# Patient Record
Sex: Male | Born: 1970 | Race: Black or African American | Hispanic: No | Marital: Single | Smoking: Never smoker
Health system: Southern US, Community
[De-identification: ages and names within clinical notes are randomized; demographics above are authoritative.]

## PROBLEM LIST (undated history)

## (undated) DIAGNOSIS — U071 COVID-19: Secondary | ICD-10-CM

---

## 2005-03-20 ENCOUNTER — Ambulatory Visit: Payer: Self-pay | Admitting: Family Medicine

## 2018-04-16 ENCOUNTER — Emergency Department (HOSPITAL_COMMUNITY)
Admission: EM | Admit: 2018-04-16 | Discharge: 2018-04-16 | Disposition: A | Discharge status: Home / Self Care | Payer: Self-pay | Attending: Emergency Medicine | Admitting: Emergency Medicine

## 2018-04-16 ENCOUNTER — Emergency Department (HOSPITAL_COMMUNITY): Payer: Self-pay

## 2018-04-16 ENCOUNTER — Other Ambulatory Visit: Payer: Self-pay

## 2018-04-16 ENCOUNTER — Encounter (HOSPITAL_COMMUNITY): Payer: Self-pay

## 2018-04-16 DIAGNOSIS — M545 Low back pain, unspecified: Secondary | ICD-10-CM

## 2018-04-16 DIAGNOSIS — K5900 Constipation, unspecified: Secondary | ICD-10-CM

## 2018-04-16 MED ORDER — DIAZEPAM 5 MG PO TABS
5.0000 mg | ORAL_TABLET | Freq: Once | ORAL | Status: AC
  Administered 2018-04-16: 5 mg via ORAL
  Filled 2018-04-16: qty 1

## 2018-04-16 MED ORDER — ACETAMINOPHEN 500 MG PO TABS
1000.0000 mg | ORAL_TABLET | Freq: Once | ORAL | Status: AC
  Administered 2018-04-16: 1000 mg via ORAL
  Filled 2018-04-16: qty 2

## 2018-04-16 MED ORDER — TRAMADOL HCL 50 MG PO TABS
100.0000 mg | ORAL_TABLET | Freq: Once | ORAL | Status: AC
  Administered 2018-04-16: 100 mg via ORAL
  Filled 2018-04-16: qty 2

## 2018-04-16 MED ORDER — NAPROXEN 500 MG PO TABS: ORAL_TABLET | ORAL | 0 refills | Status: DC

## 2018-04-16 MED ORDER — FAMOTIDINE 20 MG PO TABS: 20.0000 mg | ORAL_TABLET | Freq: Two times a day (BID) | ORAL | 0 refills | Status: DC

## 2018-04-16 MED ORDER — DEXAMETHASONE SODIUM PHOSPHATE 10 MG/ML IJ SOLN
10.0000 mg | Freq: Once | INTRAMUSCULAR | Status: AC
  Administered 2018-04-16: 10 mg via INTRAMUSCULAR
  Filled 2018-04-16: qty 1

## 2018-04-16 MED ORDER — PREDNISONE 20 MG PO TABS: ORAL_TABLET | ORAL | 0 refills | Status: DC

## 2018-04-16 MED ORDER — KETOROLAC TROMETHAMINE 30 MG/ML IJ SOLN
30.0000 mg | Freq: Once | INTRAMUSCULAR | Status: AC
  Administered 2018-04-16: 30 mg via INTRAMUSCULAR
  Filled 2018-04-16: qty 1

## 2018-04-16 MED ORDER — CYCLOBENZAPRINE HCL 10 MG PO TABS: 10.0000 mg | ORAL_TABLET | Freq: Three times a day (TID) | ORAL | 0 refills | Status: DC | PRN

## 2018-04-16 NOTE — Discharge Instructions (Signed)
Get miralax and put one dose or 17 g in 8 ounces of water,  take 1 dose every 30 minutes for 2-3 hours or until you  get good results and then once or twice daily to prevent constipation.   Use ice and heat on your back for comfort.  Take the prescriptions as prescribed.  You can also take acetaminophen 1000 mg 4 times a day for the pain.  Recheck as needed.

## 2018-04-16 NOTE — ED Triage Notes (Addendum)
Pt reports lower back pain that started yesterday, woke up this morning to use the bathroom and got chills, diaphoretic and had cramps like "had to use the bathroom", but unable to. Pt says "feels like he has gas". Pt reports last BM was yesterday and before that was a couple of days. Pt says he feels constipated.
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
## 2018-04-16 NOTE — ED Notes (Signed)
Pt in xray

## 2019-04-24 ENCOUNTER — Emergency Department (HOSPITAL_COMMUNITY)
Admission: EM | Admit: 2019-04-24 | Discharge: 2019-04-24 | Disposition: A | Discharge status: Home / Self Care | Payer: HRSA Program | Attending: Emergency Medicine | Admitting: Emergency Medicine

## 2019-04-24 ENCOUNTER — Other Ambulatory Visit: Payer: Self-pay

## 2019-04-24 ENCOUNTER — Emergency Department (HOSPITAL_COMMUNITY): Payer: HRSA Program

## 2019-04-24 ENCOUNTER — Encounter (HOSPITAL_COMMUNITY): Payer: Self-pay | Admitting: *Deleted

## 2019-04-24 DIAGNOSIS — U071 COVID-19: Secondary | ICD-10-CM | POA: Insufficient documentation

## 2019-04-24 DIAGNOSIS — Z79899 Other long term (current) drug therapy: Secondary | ICD-10-CM | POA: Diagnosis not present

## 2019-04-24 DIAGNOSIS — R197 Diarrhea, unspecified: Secondary | ICD-10-CM | POA: Diagnosis not present

## 2019-04-24 DIAGNOSIS — R509 Fever, unspecified: Secondary | ICD-10-CM | POA: Diagnosis present

## 2019-04-24 DIAGNOSIS — R05 Cough: Secondary | ICD-10-CM | POA: Diagnosis not present

## 2019-04-24 DIAGNOSIS — B349 Viral infection, unspecified: Secondary | ICD-10-CM

## 2019-04-24 DIAGNOSIS — M7918 Myalgia, other site: Secondary | ICD-10-CM | POA: Diagnosis not present

## 2019-04-24 MED ORDER — BENZONATATE 100 MG PO CAPS: 100.0000 mg | ORAL_CAPSULE | Freq: Three times a day (TID) | ORAL | 0 refills | Status: DC

## 2019-04-24 MED ORDER — ALBUTEROL SULFATE HFA 108 (90 BASE) MCG/ACT IN AERS
2.0000 | INHALATION_SPRAY | Freq: Once | RESPIRATORY_TRACT | Status: AC
  Administered 2019-04-24: 15:00:00 2 via RESPIRATORY_TRACT
  Filled 2019-04-24: qty 6.7

## 2019-04-24 MED ORDER — PREDNISONE 10 MG (21) PO TBPK: ORAL_TABLET | ORAL | 0 refills | Status: DC

## 2019-04-24 MED ORDER — BENZONATATE 100 MG PO CAPS
200.0000 mg | ORAL_CAPSULE | Freq: Once | ORAL | Status: AC
  Administered 2019-04-24: 13:00:00 200 mg via ORAL
  Filled 2019-04-24: qty 2

## 2019-04-24 MED ORDER — ACETAMINOPHEN 500 MG PO TABS
1000.0000 mg | ORAL_TABLET | Freq: Once | ORAL | Status: AC
  Administered 2019-04-24: 1000 mg via ORAL
  Filled 2019-04-24: qty 2

## 2019-04-24 MED ORDER — PREDNISONE 50 MG PO TABS
60.0000 mg | ORAL_TABLET | Freq: Once | ORAL | Status: AC
  Administered 2019-04-24: 60 mg via ORAL
  Filled 2019-04-24: qty 1

## 2019-04-24 NOTE — ED Provider Notes (Signed)
[facility] EMERGENCY DEPARTMENT Provider Note   CSN: [id number] Arrival date & time: 04/24/19  1127     History Chief Complaint  Patient presents with  . Fever  . Generalized Body Aches  . Diarrhea    Benjamin Robinson is a 49 y.o. male.  HPI      Benjamin Robinson is a 49 y.o. male, patient with no pertinent past medical history, presenting to the ED with cough and shortness of breath for the past week.  Also endorses fatigue, fevers, soft stools, and body aches.  He has intermittently been taking ibuprofen with last dose several days ago. Patient denies contact with known or suspected COVID-19 patients.  Denies exertional chest pain, abdominal pain, nausea, vomiting, syncope, or any other complaints.      History reviewed. No pertinent past medical history.  There are no problems to display for this patient.   History reviewed. No pertinent surgical history.     History reviewed. No pertinent family history.  Social History   Tobacco Use  . Smoking status: Never Smoker  . Smokeless tobacco: Never Used  Substance Use Topics  . Alcohol use: Never  . Drug use: Never    Home Medications Prior to Admission medications   Medication Sig Start Date End Date Taking? Authorizing Provider  benzonatate (TESSALON) 100 MG capsule Take 1 capsule (100 mg total) by mouth every 8 (eight) hours. 04/24/19   Benjamin Taite C, PA-Robinson  cyclobenzaprine (FLEXERIL) 10 MG tablet Take 1 tablet (10 mg total) by mouth 3 (three) times daily as needed for muscle spasms. 04/16/18   Benjamin Porter, MD  famotidine (PEPCID) 20 MG tablet Take 1 tablet (20 mg total) by mouth 2 (two) times daily. 04/16/18   Benjamin Porter, MD  naproxen (NAPROSYN) 500 MG tablet Take 1 po BID with food prn pain 04/16/18   Benjamin Porter, MD  predniSONE (STERAPRED UNI-PAK 21 TAB) 10 MG (21) TBPK tablet Take 6 tabs (62m) day 1, 5 tabs (554m day 2, 4 tabs (4089mday 3, 3 tabs (49m87may 4, 2 tabs (20mg81my 5, and 1 tab (10mg)79m 6. 04/24/19    Benjamin Krammes C, PA-Robinson    Allergies    Patient has no known allergies.  Review of Systems   Review of Systems  Constitutional: Positive for fatigue and fever.  Respiratory: Positive for cough and shortness of breath.   Cardiovascular: Negative for chest pain and leg swelling.  Gastrointestinal: Positive for diarrhea. Negative for abdominal pain, blood in stool, nausea and vomiting.  Musculoskeletal: Positive for myalgias.  Neurological: Negative for dizziness and syncope.  All other systems reviewed and are negative.   Physical Exam Updated Vital Signs BP 130/87   Pulse 72   Temp (!) 100.6 F (38.1 Robinson) (Oral)   Resp 16   Ht 5' 9"  (1.753 m)   SpO2 97%   BMI 30.27 kg/m   Physical Exam Vitals and nursing note reviewed.  Constitutional:      General: He is not in acute distress.    Appearance: He is well-developed. He is not diaphoretic.  HENT:     Head: Normocephalic and atraumatic.     Mouth/Throat:     Mouth: Mucous membranes are moist.     Pharynx: Oropharynx is clear.  Eyes:     Conjunctiva/sclera: Conjunctivae normal.  Cardiovascular:     Rate and Rhythm: Normal rate and regular rhythm.     Pulses: Normal pulses.  Radial pulses are 2+ on the right side and 2+ on the left side.       Posterior tibial pulses are 2+ on the right side and 2+ on the left side.     Heart sounds: Normal heart sounds.     Comments: Tactile temperature in the extremities appropriate and equal bilaterally. Pulmonary:     Effort: Pulmonary effort is normal. No respiratory distress.     Breath sounds: Normal breath sounds.     Comments: No increased work of breathing.  Speaks in full sentences without difficulty. Abdominal:     Palpations: Abdomen is soft.     Tenderness: There is no abdominal tenderness. There is no guarding.  Musculoskeletal:     Cervical back: Neck supple.     Right lower leg: No edema.     Left lower leg: No edema.  Lymphadenopathy:     Cervical: No cervical  adenopathy.  Skin:    General: Skin is warm and dry.  Neurological:     Mental Status: He is alert.  Psychiatric:        Mood and Affect: Mood and affect normal.        Speech: Speech normal.        Behavior: Behavior normal.     ED Results / Procedures / Treatments   Labs (all labs ordered are listed, but only abnormal results are displayed) Labs Reviewed  NOVEL CORONAVIRUS, NAA (HOSP ORDER, SEND-OUT TO REF LAB; TAT 18-24 HRS)    EKG None  Radiology DG Chest Portable 1 View  Result Date: 04/24/2019 CLINICAL DATA:  Fever, body aches, diarrhea EXAM: PORTABLE CHEST 1 VIEW COMPARISON:  None. FINDINGS: The heart size and mediastinal contours are within normal limits. No atelectasis or airspace consolidation identified. Mild increase interstitial markings are noted in both lungs with a lower lung zone predominance. The visualized skeletal structures are unremarkable. IMPRESSION: 1. Mild nonspecific increased interstitial markings. Findings may reflect pulmonary vascular congestion versus early atypical inflammation/infection no airspace consolidation noted. Electronically Signed   By: Kerby Moors M.D.   On: 04/24/2019 13:03    Procedures Procedures (including critical care time)  Medications Ordered in ED Medications  benzonatate (TESSALON) capsule 200 mg (200 mg Oral Given 04/24/19 1302)  acetaminophen (TYLENOL) tablet 1,000 mg (1,000 mg Oral Given 04/24/19 1302)  albuterol (VENTOLIN HFA) 108 (90 Base) MCG/ACT inhaler 2 puff (2 puffs Inhalation Given 04/24/19 1440)  predniSONE (DELTASONE) tablet 60 mg (60 mg Oral Given 04/24/19 1440)    ED Course  I have reviewed the triage vital signs and the nursing notes.  Pertinent labs & imaging results that were available during my care of the patient were reviewed by me and considered in my medical decision making (see chart for details).    MDM Rules/Calculators/A&P                      Patient presents with cough, shortness of breath,  and body aches. Patient is nontoxic appearing, not tachycardic, not tachypneic, not hypotensive, maintains excellent SPO2 on room air, and is in no apparent distress.  Mildly febrile. Patient voiced improvement with oral medications here in the ED. Chest x-ray without consolidation.  At this time, patient symptoms likely stem from a viral source. Coronavirus test pending. The patient was given instructions for home care as well as return precautions. Patient voices understanding of these instructions, accepts the plan, and is comfortable with discharge.     Benjamin Robinson was  evaluated in Emergency Department on 04/24/2019 for the symptoms described in the history of present illness. He was evaluated in the context of the global COVID-19 pandemic, which necessitated consideration that the patient might be at risk for infection with the SARS-CoV-2 virus that causes COVID-19. Institutional protocols and algorithms that pertain to the evaluation of patients at risk for COVID-19 are in a state of rapid change based on information released by regulatory bodies including the CDC and federal and state organizations. These policies and algorithms were followed during the patient's care in the ED.  Vitals:   04/24/19 1143 04/24/19 1310 04/24/19 1400 04/24/19 1430  BP:  (!) 115/100  128/86  Pulse:  97 88 80  Resp:  18 18 18   Temp:    99.1 F (37.3 Robinson)  TempSrc:      SpO2:  100% 97% 98%  Height: 5' 9"  (1.753 m)       Final Clinical Impression(s) / ED Diagnoses Final diagnoses:  Viral syndrome    Rx / DC Orders ED Discharge Orders         Ordered    predniSONE (STERAPRED UNI-PAK 21 TAB) 10 MG (21) TBPK tablet     04/24/19 1351    benzonatate (TESSALON) 100 MG capsule  Every 8 hours     04/24/19 1351           Lorayne Bender, PA-Robinson 04/24/19 1507    Noemi Chapel, MD 05/04/19 1747

## 2019-04-24 NOTE — ED Triage Notes (Signed)
Fever, body aches for the past week, also has diarrhea

## 2019-04-24 NOTE — Discharge Instructions (Addendum)
Test Results for COVID-19 pending  You have a test pending for COVID-19.  Results typically return within about 48 hours.  Be sure to check MyChart for updated results.  We recommend isolating yourself until results are received.  Patients who have symptoms consistent with COVID-19 should self isolated for: At least 3 days (72 hours) have passed since recovery, defined as resolution of fever without the use of fever reducing medications and improvement in respiratory symptoms (e.g., cough, shortness of breath), and At least 7 days have passed since symptoms first appeared.  If you have no symptoms, but your test returns positive, recommend isolating for at least 10 days.   Your symptoms are likely consistent with a viral illness. Viruses do not require or respond to antibiotics. Treatment is symptomatic care and it is important to note that these symptoms may last for 7-14 days.   Hand washing: Wash your hands throughout the day, but especially before and after touching the face, using the restroom, sneezing, coughing, or touching surfaces that have been coughed or sneezed upon. Hydration: Symptoms of most illnesses will be intensified and complicated by dehydration. Dehydration can also extend the duration of symptoms. Drink plenty of fluids and get plenty of rest. You should be drinking at least half a liter of water an hour to stay hydrated. Electrolyte drinks (ex. Gatorade, Powerade, Pedialyte) are also encouraged. You should be drinking enough fluids to make your urine light yellow, almost clear. If this is not the case, you are not drinking enough water. Please note that some of the treatments indicated below will not be effective if you are not adequately hydrated. Diet: Please concentrate on hydration, however, you may introduce food slowly.  Start with a clear liquid diet, progressed to a full liquid diet, and then bland solids as you are able. Pain or fever: Ibuprofen, Naproxen, or  acetaminophen (generic for Tylenol) for pain or fever.  Antiinflammatory medications: Take 600 mg of ibuprofen every 6 hours or 440 mg (over the counter dose) to 500 mg (prescription dose) of naproxen every 12 hours for the next 3 days. After this time, these medications may be used as needed for pain. Take these medications with food to avoid upset stomach. Choose only one of these medications, do not take them together. Acetaminophen (generic for Tylenol): Should you continue to have additional pain while taking the ibuprofen or naproxen, you may add in acetaminophen as needed. Your daily total maximum amount of acetaminophen from all sources should be limited to 4064m/day for persons without liver problems, or 20019mday for those with liver problems. Nausea/vomiting: Use the ondansetron (generic for Zofran) for nausea or vomiting.  This medication may not prevent all vomiting or nausea, but can help facilitate better hydration. Things that can help with nausea/vomiting also include peppermint/menthol candies, vitamin B12, and ginger. Diarrhea: May use medications such as loperamide (Imodium) or Bismuth subsalicylate (Pepto-Bismol). Cough: Use the benzonatate (generic for Tessalon) for cough.  Teas, warm liquids, broths, and honey can also help with cough. Albuterol: May use the albuterol as needed for instances of shortness of breath. Prednisone: Take the prednisone, as directed, in its entirety. Zyrtec or Claritin: May add these medication daily to control underlying symptoms of congestion, sneezing, and other signs of allergies.  These medications are available over-the-counter. Generics: Cetirizine (generic for Zyrtec) and loratadine (generic for Claritin). Fluticasone: Use fluticasone (generic for Flonase), as directed, for nasal and sinus congestion.  This medication is available over-the-counter. Congestion: Plain guaifenesin (generic for  plain Mucinex) may help relieve congestion. Saline  sinus rinses and saline nasal sprays may also help relieve congestion. If you do not have high blood pressure, heart problems, or an allergy to such medications, you may also try phenylephrine or Sudafed. Sore throat: Warm liquids or Chloraseptic spray may help soothe a sore throat. Gargle twice a day with a salt water solution made from a half teaspoon of salt in a cup of warm water.  Follow up: Follow up with a primary care provider within the next two weeks should symptoms fail to resolve. Return: Return to the ED for significantly worsening symptoms, shortness of breath, persistent vomiting, large amounts of blood in stool, or any other major concerns.  For prescription assistance, may try using prescription discount sites or apps, such as goodrx.com

## 2019-04-25 LAB — NOVEL CORONAVIRUS, NAA (HOSP ORDER, SEND-OUT TO REF LAB; TAT 18-24 HRS): SARS-CoV-2, NAA: DETECTED — AB

## 2019-04-26 ENCOUNTER — Telehealth: Payer: Self-pay | Admitting: Nurse Practitioner

## 2019-04-26 NOTE — Telephone Encounter (Signed)
Called to discuss with patient about Covid symptoms and the use of bamlanivimab, a monoclonal antibody infusion for those with mild to moderate Covid symptoms and at a high risk of hospitalization.  Pt is not qualified for this infusion at the [facility] infusion center due to on review of notes symptoms started 04/17/2019, which would place > 10 days prior to infusion + no qualifying risks noted in chart.

## 2019-04-27 ENCOUNTER — Telehealth (HOSPITAL_COMMUNITY): Payer: Self-pay

## 2019-04-30 ENCOUNTER — Other Ambulatory Visit: Payer: Self-pay

## 2019-04-30 ENCOUNTER — Encounter (HOSPITAL_COMMUNITY): Payer: Self-pay

## 2019-04-30 ENCOUNTER — Inpatient Hospital Stay (HOSPITAL_COMMUNITY)
Admission: EM | Admit: 2019-04-30 | Discharge: 2019-05-03 | DRG: 091 | Disposition: A | Discharge status: Home / Self Care | Payer: Self-pay | Attending: Internal Medicine | Admitting: Internal Medicine

## 2019-04-30 ENCOUNTER — Emergency Department (HOSPITAL_COMMUNITY): Payer: Self-pay

## 2019-04-30 DIAGNOSIS — U071 COVID-19: Secondary | ICD-10-CM | POA: Diagnosis present

## 2019-04-30 DIAGNOSIS — J1282 Pneumonia due to coronavirus disease 2019: Secondary | ICD-10-CM | POA: Diagnosis present

## 2019-04-30 DIAGNOSIS — T380X5A Adverse effect of glucocorticoids and synthetic analogues, initial encounter: Secondary | ICD-10-CM | POA: Diagnosis present

## 2019-04-30 DIAGNOSIS — G92 Toxic encephalopathy: Principal | ICD-10-CM | POA: Diagnosis present

## 2019-04-30 DIAGNOSIS — E860 Dehydration: Secondary | ICD-10-CM | POA: Diagnosis present

## 2019-04-30 DIAGNOSIS — R319 Hematuria, unspecified: Secondary | ICD-10-CM | POA: Diagnosis present

## 2019-04-30 DIAGNOSIS — N390 Urinary tract infection, site not specified: Secondary | ICD-10-CM | POA: Diagnosis present

## 2019-04-30 DIAGNOSIS — G9340 Encephalopathy, unspecified: Secondary | ICD-10-CM | POA: Diagnosis present

## 2019-04-30 HISTORY — DX: COVID-19: U07.1

## 2019-04-30 MED ORDER — ACETAMINOPHEN 500 MG PO TABS
1000.0000 mg | ORAL_TABLET | Freq: Once | ORAL | Status: AC
  Administered 2019-04-30: 1000 mg via ORAL
  Filled 2019-04-30: qty 2

## 2019-04-30 MED ORDER — SODIUM CHLORIDE 0.9 % IV SOLN: INTRAVENOUS | Status: DC

## 2019-04-30 MED ORDER — LACTATED RINGERS IV BOLUS
1000.0000 mL | Freq: Once | INTRAVENOUS | Status: AC
  Administered 2019-04-30: 1000 mL via INTRAVENOUS

## 2019-04-30 NOTE — ED Triage Notes (Signed)
Pt brought to ED for AMS. Pt is covid +. Pt not making sense in triage. Pt diaphoretic. Pt had heavy coat and 3 shirts on. Pt unable to answer orientation questions. Pt disoriented.

## 2019-05-01 ENCOUNTER — Emergency Department (HOSPITAL_COMMUNITY): Payer: Self-pay

## 2019-05-01 ENCOUNTER — Encounter (HOSPITAL_COMMUNITY): Payer: Self-pay | Admitting: Family Medicine

## 2019-05-01 DIAGNOSIS — U071 COVID-19: Secondary | ICD-10-CM

## 2019-05-01 DIAGNOSIS — J1282 Pneumonia due to coronavirus disease 2019: Secondary | ICD-10-CM | POA: Diagnosis present

## 2019-05-01 DIAGNOSIS — G9340 Encephalopathy, unspecified: Secondary | ICD-10-CM

## 2019-05-01 HISTORY — DX: COVID-19: U07.1

## 2019-05-01 HISTORY — DX: Pneumonia due to coronavirus disease 2019: J12.82

## 2019-05-01 LAB — CBC WITH DIFFERENTIAL/PLATELET
Abs Immature Granulocytes: 0.05 10*3/uL (ref 0.00–0.07)
Basophils Absolute: 0 10*3/uL (ref 0.0–0.1)
Basophils Relative: 1 %
Eosinophils Absolute: 0 10*3/uL (ref 0.0–0.5)
Eosinophils Relative: 0 %
HCT: 53.5 % — ABNORMAL HIGH (ref 39.0–52.0)
Hemoglobin: 17.3 g/dL — ABNORMAL HIGH (ref 13.0–17.0)
Immature Granulocytes: 1 %
Lymphocytes Relative: 20 %
Lymphs Abs: 1 10*3/uL (ref 0.7–4.0)
MCH: 26.5 pg (ref 26.0–34.0)
MCHC: 32.3 g/dL (ref 30.0–36.0)
MCV: 82.1 fL (ref 80.0–100.0)
Monocytes Absolute: 0.5 10*3/uL (ref 0.1–1.0)
Monocytes Relative: 10 %
Neutro Abs: 3.3 10*3/uL (ref 1.7–7.7)
Neutrophils Relative %: 68 %
Platelets: 281 10*3/uL (ref 150–400)
RBC: 6.52 MIL/uL — ABNORMAL HIGH (ref 4.22–5.81)
RDW: 12.8 % (ref 11.5–15.5)
WBC: 4.9 10*3/uL (ref 4.0–10.5)
nRBC: 0 % (ref 0.0–0.2)

## 2019-05-01 LAB — COMPREHENSIVE METABOLIC PANEL
ALT: 89 U/L — ABNORMAL HIGH (ref 0–44)
AST: 43 U/L — ABNORMAL HIGH (ref 15–41)
Albumin: 3.9 g/dL (ref 3.5–5.0)
Alkaline Phosphatase: 49 U/L (ref 38–126)
Anion gap: 14 (ref 5–15)
BUN: 23 mg/dL — ABNORMAL HIGH (ref 6–20)
CO2: 27 mmol/L (ref 22–32)
Calcium: 9.2 mg/dL (ref 8.9–10.3)
Chloride: 94 mmol/L — ABNORMAL LOW (ref 98–111)
Creatinine, Ser: 1.21 mg/dL (ref 0.61–1.24)
GFR calc Af Amer: >60 mL/min (ref >60)
GFR calc non Af Amer: >60 mL/min (ref >60)
Glucose, Bld: 171 mg/dL — ABNORMAL HIGH (ref 70–99)
Potassium: 3.2 mmol/L — ABNORMAL LOW (ref 3.5–5.1)
Sodium: 135 mmol/L (ref 135–145)
Total Bilirubin: 1.2 mg/dL (ref 0.3–1.2)
Total Protein: 8.8 g/dL — ABNORMAL HIGH (ref 6.5–8.1)

## 2019-05-01 LAB — URINALYSIS, COMPLETE (UACMP) WITH MICROSCOPIC
Bilirubin Urine: NEGATIVE
Glucose, UA: 150 mg/dL — AB
Ketones, ur: 5 mg/dL — AB
Leukocytes,Ua: NEGATIVE
Nitrite: NEGATIVE
Protein, ur: 100 mg/dL — AB
Specific Gravity, Urine: 1.03 (ref 1.005–1.030)
pH: 6 (ref 5.0–8.0)

## 2019-05-01 LAB — LACTIC ACID, PLASMA: Lactic Acid, Venous: 1.7 mmol/L (ref 0.5–1.9)

## 2019-05-01 LAB — ABO/RH: ABO/RH(D): O NEG

## 2019-05-01 LAB — CBG MONITORING, ED: Glucose-Capillary: 141 mg/dL — ABNORMAL HIGH (ref 70–99)

## 2019-05-01 LAB — RAPID URINE DRUG SCREEN, HOSP PERFORMED
Amphetamines: NOT DETECTED
Barbiturates: NOT DETECTED
Benzodiazepines: NOT DETECTED
Cocaine: NOT DETECTED
Opiates: NOT DETECTED
Tetrahydrocannabinol: NOT DETECTED

## 2019-05-01 LAB — HIV ANTIBODY (ROUTINE TESTING W REFLEX): HIV Screen 4th Generation wRfx: NONREACTIVE

## 2019-05-01 MED ORDER — SODIUM CHLORIDE 0.9 % IV SOLN: INTRAVENOUS | Status: DC

## 2019-05-01 MED ORDER — POTASSIUM CHLORIDE 10 MEQ/100ML IV SOLN
10.0000 meq | INTRAVENOUS | Status: AC
  Administered 2019-05-01 (×3): 10 meq via INTRAVENOUS
  Filled 2019-05-01 (×3): qty 100

## 2019-05-01 MED ORDER — ONDANSETRON HCL 4 MG/2ML IJ SOLN: 4.0000 mg | Freq: Four times a day (QID) | INTRAMUSCULAR | Status: DC | PRN

## 2019-05-01 MED ORDER — SODIUM CHLORIDE 0.9 % IV SOLN
200.0000 mg | Freq: Once | INTRAVENOUS | Status: AC
  Administered 2019-05-01: 200 mg via INTRAVENOUS
  Filled 2019-05-01: qty 40

## 2019-05-01 MED ORDER — LACTATED RINGERS IV SOLN: INTRAVENOUS | Status: DC

## 2019-05-01 MED ORDER — POTASSIUM CHLORIDE CRYS ER 20 MEQ PO TBCR
40.0000 meq | EXTENDED_RELEASE_TABLET | Freq: Once | ORAL | Status: AC
  Administered 2019-05-01: 40 meq via ORAL
  Filled 2019-05-01: qty 2

## 2019-05-01 MED ORDER — ONDANSETRON HCL 4 MG PO TABS: 4.0000 mg | ORAL_TABLET | Freq: Four times a day (QID) | ORAL | Status: DC | PRN

## 2019-05-01 MED ORDER — SODIUM CHLORIDE 0.9 % IV SOLN: 200.0000 mg | Freq: Once | INTRAVENOUS | Status: DC

## 2019-05-01 MED ORDER — MAGIC MOUTHWASH
15.0000 mL | Freq: Four times a day (QID) | ORAL | Status: DC | PRN
  Administered 2019-05-01 – 2019-05-02 (×3): 15 mL via ORAL
  Filled 2019-05-01 (×2): qty 15

## 2019-05-01 MED ORDER — ACETAMINOPHEN 325 MG PO TABS
650.0000 mg | ORAL_TABLET | Freq: Four times a day (QID) | ORAL | Status: DC | PRN
  Administered 2019-05-02 (×2): 650 mg via ORAL
  Filled 2019-05-01 (×2): qty 2

## 2019-05-01 MED ORDER — LORAZEPAM 2 MG/ML IJ SOLN
1.0000 mg | Freq: Once | INTRAMUSCULAR | Status: AC
  Administered 2019-05-01: 1 mg via INTRAVENOUS
  Filled 2019-05-01: qty 1

## 2019-05-01 MED ORDER — SODIUM CHLORIDE 0.9 % IV SOLN
100.0000 mg | Freq: Every day | INTRAVENOUS | Status: DC
  Administered 2019-05-02 – 2019-05-03 (×2): 100 mg via INTRAVENOUS
  Filled 2019-05-01: qty 100
  Filled 2019-05-01 (×2): qty 20

## 2019-05-01 MED ORDER — ENOXAPARIN SODIUM 40 MG/0.4ML ~~LOC~~ SOLN
40.0000 mg | SUBCUTANEOUS | Status: DC
  Administered 2019-05-01 – 2019-05-03 (×3): 40 mg via SUBCUTANEOUS
  Filled 2019-05-01 (×3): qty 0.4

## 2019-05-01 MED ORDER — SODIUM CHLORIDE 0.9 % IV SOLN
1.0000 g | INTRAVENOUS | Status: DC
  Administered 2019-05-01 – 2019-05-02 (×2): 1 g via INTRAVENOUS
  Filled 2019-05-01 (×2): qty 10

## 2019-05-01 MED ORDER — SODIUM CHLORIDE 0.9 % IV SOLN: 100.0000 mg | Freq: Every day | INTRAVENOUS | Status: DC

## 2019-05-01 NOTE — ED Notes (Signed)
Patient transported to CT 

## 2019-05-01 NOTE — ED Notes (Signed)
NAD  Pt continues to await bed assignment

## 2019-05-01 NOTE — ED Provider Notes (Signed)
Emergency Department Provider Note   I have reviewed the triage vital signs and the nursing notes.   HISTORY  Chief Complaint Altered Mental Status   HPI Benjamin Robinson is a 49 y.o. male who presents to the emergency department today for altered mental status.  Patient is disoriented is unable to give history.   History is obtained from his fiance.  She states that he was diagnosed with coronavirus.  He started having a cough shortly after Christmas was diagnosed on New Year's Day.  He was seen here given a prescription of prednisone and he started that on January 4 that sounds like.  He took 3 doses and started having progressively worsening abnormal behavior to the point where the last couple days he has been having hallucinations, delusions.  Has no psychiatric history.  Nothing at this before.  No alcohol or drugs per the fianc. Patient denies headache or neck pain.   No other associated or modifying symptoms.   Level V Caveat 2/2 disorientation and encelopathy  Past Medical History:  Diagnosis Date  . COVID-19     There are no problems to display for this patient.   History reviewed. No pertinent surgical history.  Current Outpatient Rx  . Order #: [id number] Class: Normal  . Order #: [id number] Class: Normal  . Order #: [id number] Class: Print  . Order #: [id number] Class: Print  . Order #: [id number] Class: Print    Allergies Patient has no known allergies.  No family history on file.  Social History Social History   Tobacco Use  . Smoking status: Never Smoker  . Smokeless tobacco: Never Used  Substance Use Topics  . Alcohol use: Never  . Drug use: Never    Review of Systems  All other systems negative except as documented in the HPI. All pertinent positives and negatives as reviewed in the HPI. ____________________________________________   PHYSICAL EXAM:  VITAL SIGNS: ED Triage Vitals  Enc Vitals Group     BP 04/30/19 2310 (!) 134/104     Pulse Rate  04/30/19 2310 (!) 124     Resp 04/30/19 2310 20     Temp 04/30/19 2310 99.2 F (37.3 C)     Temp Source 04/30/19 2310 Oral     SpO2 04/30/19 2310 98 %     Weight 04/30/19 2306 205 lb 0.4 oz (93 kg)     Height 04/30/19 2306 5' 9"  (1.753 m)    Constitutional: Alert but disoriented to place, time and situation. Eyes: Conjunctivae are normal. PERRL. EOMI. Head: Atraumatic. Nose: No congestion/rhinnorhea. Mouth/Throat: Mucous membranes are dry.  Oropharynx non-erythematous. Neck: No stridor.  No meningeal signs.   Cardiovascular: tachycardic rate, regular rhythm. Good peripheral circulation. Grossly normal heart sounds.   Respiratory: tachypneic respiratory effort.  No retractions. Lungs CTAB. Gastrointestinal: Soft and nontender. No distention.  Musculoskeletal: No lower extremity tenderness nor edema. No gross deformities of extremities. Neurologic:  Normal speech and language. No gross focal neurologic deficits are appreciated.  Skin:  Skin is warm, dry and intact. No rash noted. Psychiatric: darting eyes, responding to internal stimuli, actively hallucinating  ____________________________________________   LABS (all labs ordered are listed, but only abnormal results are displayed)  Labs Reviewed  COMPREHENSIVE METABOLIC PANEL - Abnormal; Notable for the following components:      Result Value   Potassium 3.2 (*)    Chloride 94 (*)    Glucose, Bld 171 (*)    BUN 23 (*)    Total Protein 8.8 (*)  AST 43 (*)    ALT 89 (*)    All other components within normal limits  CBC WITH DIFFERENTIAL/PLATELET - Abnormal; Notable for the following components:   RBC 6.52 (*)    Hemoglobin 17.3 (*)    HCT 53.5 (*)    All other components within normal limits  URINALYSIS, COMPLETE (UACMP) WITH MICROSCOPIC - Abnormal; Notable for the following components:   Color, Urine AMBER (*)    APPearance HAZY (*)    Glucose, UA 150 (*)    Hgb urine dipstick MODERATE (*)    Ketones, ur 5 (*)     Protein, ur 100 (*)    Bacteria, UA RARE (*)    Non Squamous Epithelial 0-5 (*)    All other components within normal limits  CBG MONITORING, ED - Abnormal; Notable for the following components:   Glucose-Capillary 141 (*)    All other components within normal limits  CULTURE, BLOOD (ROUTINE X 2)  CULTURE, BLOOD (ROUTINE X 2)  URINE CULTURE  RAPID URINE DRUG SCREEN, HOSP PERFORMED  LACTIC ACID, PLASMA   ____________________________________________  EKG   EKG Interpretation  Date/Time:  Thursday April 30 2019 23:07:35 EST Ventricular Rate:  115 PR Interval:    QRS Duration: 96 QT Interval:  323 QTC Calculation: 447 R Axis:   48 Text Interpretation: Sinus tachycardia Left atrial enlargement No old tracing to compare Confirmed by Merrily Pew [phone]) on 05/01/2019 12:47:16 AM       ____________________________________________  RADIOLOGY  DG Chest Port 1 View  Result Date: 04/30/2019 CLINICAL DATA:  Altered mental status, COVID-19 positivity EXAM: PORTABLE CHEST 1 VIEW COMPARISON:  04/24/2019 FINDINGS: Cardiac shadow is within normal limits. Increasing patchy airspace opacities are noted when compared with the prior exam consistent with the given clinical history of COVID-19 positivity. No sizable effusion is seen. No bony abnormality is noted. IMPRESSION: Increasing patchy opacities bilaterally consistent with the given clinical history. Electronically Signed   By: Inez Catalina M.D.   On: 04/30/2019 23:46    ____________________________________________   PROCEDURES  Procedure(s) performed:   .Critical Care Performed by: Merrily Pew, MD Authorized by: Merrily Pew, MD   Critical care provider statement:    Critical care time (minutes):  45   Critical care was necessary to treat or prevent imminent or life-threatening deterioration of the following conditions:  Sepsis, dehydration and CNS failure or compromise   Critical care was time spent personally by me on the  following activities:  Discussions with consultants, evaluation of patient's response to treatment, examination of patient, ordering and performing treatments and interventions, ordering and review of laboratory studies, ordering and review of radiographic studies, pulse oximetry, re-evaluation of patient's condition, obtaining history from patient or surrogate and review of old charts   ____________________________________________   INITIAL IMPRESSION / [place] / ED COURSE  Patient likely with adverse effect from the prednisone versus encephalopathy from the coronavirus.  Low suspicion for bacterial meningitis at this time.  Could be viral meningitis but no indication for lumbar puncture as the differential still relatively wide.  Patient is also with a fever, tachycardia and tachypnea and is chest x-ray is consistent with Covid, doubt pulmonary embolus at this time.  Will discuss with hospitalist for observation for improvement in his mental status. Not requiring sedatives at this time.  Fiance updated with this plan.  Ativan given for mild anxiety.  Pertinent labs & imaging results that were available during my care of the patient were reviewed  by me and considered in my medical decision making (see chart for details). ____________________________________________  FINAL CLINICAL IMPRESSION(S) / ED DIAGNOSES  Final diagnoses:  Encephalopathy  COVID-19    MEDICATIONS GIVEN DURING THIS VISIT:  Medications  0.9 %  sodium chloride infusion ( Intravenous New Bag/Given 04/30/19 2359)  acetaminophen (TYLENOL) tablet 1,000 mg (1,000 mg Oral Given 04/30/19 2358)  lactated ringers bolus 1,000 mL (1,000 mLs Intravenous New Bag/Given 04/30/19 2359)    NEW OUTPATIENT MEDICATIONS STARTED DURING THIS VISIT:  New Prescriptions   No medications on file    Note:  This note was prepared with assistance of Dragon voice recognition software. Occasional wrong-word or sound-a-like substitutions  may have occurred due to the inherent limitations of voice recognition software.   Jacqulynn Shappell, Corene Cornea, MD 05/01/19 [phone]

## 2019-05-01 NOTE — Progress Notes (Signed)
Patient admitted to the hospital by Dr. Darrick Meigs earlier this morning.  Patient seen and examined.  Sleeping on my arrival, but wakes up to voice.  He answers questions or orientation correctly, but is slow to respond.  Does not appear to be in any gross focal neurologic deficits.  49 year old male with recent diagnosis of COVID-19, was discharged on a prednisone taper.  He became acutely confused and was having hallucinations and delusions.  Was brought to the hospital for evaluation.  Urinalysis indicated hematuria and there was concern for possible infection so he was started on Rocephin.  Lab work indicates some mild dehydration.  He has been started on IV fluids.  He was mildly febrile in the emergency room, but during my evaluation, he is awake, answers questions appropriately, no neck stiffness.  We will continue to monitor over the next 24 hours.  If he continues to improve, anticipate discharge home tomorrow.

## 2019-05-01 NOTE — ED Notes (Signed)
RN spoke to Pts Significant other Lattie Haw) via telephone to update on Pts status. Pt remains confused, disoriented X 4 and experiencing hallucinations.

## 2019-05-01 NOTE — ED Notes (Signed)
Pt assisted to toilet. Pt ate 75%of meal

## 2019-05-01 NOTE — ED Notes (Signed)
Per Pts fiance Pt has not been sleeping for apprx 5 days and reports the prednisone the Pt was recently prescribed is what she believes led to Pts hallucinations and racing thoughts.

## 2019-05-01 NOTE — ED Notes (Signed)
Report to [place], [place]

## 2019-05-01 NOTE — Progress Notes (Signed)
Patient alert to situation, place and self. Disoriented to time. Assisted to bathroom 1 assist. Patient unsteady on feet and weak. Patient denies pain but c/o chills, aches, and fatigue. Continue to monitor.

## 2019-05-01 NOTE — ED Notes (Signed)
Pt NAD  Continues to await room assignment

## 2019-05-01 NOTE — H&P (Signed)
TRH H&P    Patient Demographics:    Benjamin Robinson, is a 49 y.o. male  MRN: [id number]  DOB - August 10, 1970  Admit Date - 04/30/2019  Referring MD/NP/PA: Dorise Bullion  Outpatient Primary MD for the patient is [facility]  Patient coming from: Home  Chief complaint-altered mental status   HPI:    Benjamin Robinson  is a 49 y.o. male, was brought to ED with complaints of altered mental status.  Patient was recently diagnosed with COVID-19 infection on 04/24/2019 and was discharged on prednisone taper along with Tessalon Perles every 8 hours.  As per the patient's significant other, patient became confused, he took 2 doses and started having abnormal behavior with hallucinations and delusions.  Patient does not have previous history of psychiatric illness in the past.  No history of alcohol or drug use.  Patient is answering questions but is pleasantly confused.  He complains of pain in his penis, UA showed hematuria.  He denies chest pain or shortness of breath. Patient is not requiring oxygen, SPO2 100% on room air Chest x-ray showed bilateral infiltrates Patient is febrile with T-max 100.9 CT head is unremarkable   Review of systems:    In addition to the HPI above,    All other systems reviewed and are negative.    Past History of the following :    Past Medical History:  Diagnosis Date  . COVID-19          Social History:      Social History   Tobacco Use  . Smoking status: Never Smoker  . Smokeless tobacco: Never Used  Substance Use Topics  . Alcohol use: Never       Family History :   Unobtainable as patient is in altered mental status   Home Medications:   Prior to Admission medications   Medication Sig Start Date End Date Taking? Authorizing Provider  predniSONE (STERAPRED UNI-PAK 21 TAB) 10 MG (21) TBPK tablet Take 6 tabs (72m) day 1, 5 tabs (570m day 2, 4 tabs  (4047mday 3, 3 tabs (5m11may 4, 2 tabs (20mg89my 5, and 1 tab (10mg)20m 6. 04/24/19  Yes Joy, Shawn C, PA-C  benzonatate (TESSALON) 100 MG capsule Take 1 capsule (100 mg total) by mouth every 8 (eight) hours. 04/24/19   Joy, Shawn C, PA-C  cyclobenzaprine (FLEXERIL) 10 MG tablet Take 1 tablet (10 mg total) by mouth 3 (three) times daily as needed for muscle spasms. 04/16/18   Knapp,Rolland Porterfamotidine (PEPCID) 20 MG tablet Take 1 tablet (20 mg total) by mouth 2 (two) times daily. 04/16/18   Knapp,Rolland Porternaproxen (NAPROSYN) 500 MG tablet Take 1 po BID with food prn pain 04/16/18   Knapp,Rolland Porter   Allergies:    No Known Allergies   Physical Exam:   Vitals  Blood pressure (!) 141/74, pulse (!) 102, temperature (!) 100.9 F (38.3 C), temperature source Rectal, resp. rate 18, height 5' 9"  (1.753 m), weight 93 kg, SpO2  100 %.  1.  General: Appears in no acute distress  2. Psychiatric: Alert, oriented to self only  3. Neurologic: Cranial nerves II through XII grossly intact, moving all extremities  4. HEENMT:  Atraumatic normocephalic, extraocular muscles are intact  5. Respiratory : Clear to auscultation bilaterally  6. Cardiovascular : S1-S2, regular, no murmur auscultated  7. Gastrointestinal:  Abdomen is soft, nontender, no organomegaly      Data Review:    CBC Recent Labs  Lab 04/30/19 2335  WBC 4.9  HGB 17.3*  HCT 53.5*  PLT 281  MCV 82.1  MCH 26.5  MCHC 32.3  RDW 12.8  LYMPHSABS 1.0  MONOABS 0.5  EOSABS 0.0  BASOSABS 0.0   ------------------------------------------------------------------------------------------------------------------  Results for orders placed or performed during the hospital encounter of 04/30/19 (from the past 48 hour(s))  Comprehensive metabolic panel     Status: Abnormal   Collection Time: 04/30/19 11:35 PM  Result Value Ref Range   Sodium 135 135 - 145 mmol/L   Potassium 3.2 (L) 3.5 - 5.1 mmol/L   Chloride 94 (L) 98  - 111 mmol/L   CO2 27 22 - 32 mmol/L   Glucose, Bld 171 (H) 70 - 99 mg/dL   BUN 23 (H) 6 - 20 mg/dL   Creatinine, Ser 1.21 0.61 - 1.24 mg/dL   Calcium 9.2 8.9 - 10.3 mg/dL   Total Protein 8.8 (H) 6.5 - 8.1 g/dL   Albumin 3.9 3.5 - 5.0 g/dL   AST 43 (H) 15 - 41 U/L   ALT 89 (H) 0 - 44 U/L   Alkaline Phosphatase 49 38 - 126 U/L   Total Bilirubin 1.2 0.3 - 1.2 mg/dL   GFR calc non Af Amer >60 >60 mL/min   GFR calc Af Amer >60 >60 mL/min   Anion gap 14 5 - 15    Comment: Performed at [facility], [address], [place], [place] [address]  CBC WITH DIFFERENTIAL     Status: Abnormal   Collection Time: 04/30/19 11:35 PM  Result Value Ref Range   WBC 4.9 4.0 - 10.5 K/uL   RBC 6.52 (H) 4.22 - 5.81 MIL/uL   Hemoglobin 17.3 (H) 13.0 - 17.0 g/dL   HCT 53.5 (H) 39.0 - 52.0 %   MCV 82.1 80.0 - 100.0 fL   MCH 26.5 26.0 - 34.0 pg   MCHC 32.3 30.0 - 36.0 g/dL   RDW 12.8 11.5 - 15.5 %   Platelets 281 150 - 400 K/uL   nRBC 0.0 0.0 - 0.2 %   Neutrophils Relative % 68 %   Neutro Abs 3.3 1.7 - 7.7 K/uL   Lymphocytes Relative 20 %   Lymphs Abs 1.0 0.7 - 4.0 K/uL   Monocytes Relative 10 %   Monocytes Absolute 0.5 0.1 - 1.0 K/uL   Eosinophils Relative 0 %   Eosinophils Absolute 0.0 0.0 - 0.5 K/uL   Basophils Relative 1 %   Basophils Absolute 0.0 0.0 - 0.1 K/uL   Immature Granulocytes 1 %   Abs Immature Granulocytes 0.05 0.00 - 0.07 K/uL    Comment: Performed at [facility], [address] [place], [place] [address]  Lactic acid, plasma     Status: None   Collection Time: 04/30/19 11:35 PM  Result Value Ref Range   Lactic Acid, Venous 1.7 0.5 - 1.9 mmol/L    Comment: Performed at [facility], [address], [place],  [address]  CBG monitoring, ED     Status:  Abnormal   Collection Time: 05/01/19 12:00 AM  Result Value Ref Range   Glucose-Capillary 141 (H) 70 - 99 mg/dL  Urinalysis, Complete w Microscopic     Status: Abnormal   Collection Time: 05/01/19 12:21 AM  Result Value Ref  Range   Color, Urine AMBER (A) YELLOW    Comment: BIOCHEMICALS MAY BE AFFECTED BY COLOR   APPearance HAZY (A) CLEAR   Specific Gravity, Urine 1.030 1.005 - 1.030   pH 6.0 5.0 - 8.0   Glucose, UA 150 (A) NEGATIVE mg/dL   Hgb urine dipstick MODERATE (A) NEGATIVE   Bilirubin Urine NEGATIVE NEGATIVE   Ketones, ur 5 (A) NEGATIVE mg/dL   Protein, ur 100 (A) NEGATIVE mg/dL   Nitrite NEGATIVE NEGATIVE   Leukocytes,Ua NEGATIVE NEGATIVE   RBC / HPF 21-50 0 - 5 RBC/hpf   WBC, UA 6-10 0 - 5 WBC/hpf   Bacteria, UA RARE (A) NONE SEEN   Mucus PRESENT    Non Squamous Epithelial 0-5 (A) NONE SEEN    Comment: Performed at [facility], [address] [place], [place] [address]  Urine rapid drug screen (hosp performed)     Status: None   Collection Time: 05/01/19 12:21 AM  Result Value Ref Range   Opiates NONE DETECTED NONE DETECTED   Cocaine NONE DETECTED NONE DETECTED   Benzodiazepines NONE DETECTED NONE DETECTED   Amphetamines NONE DETECTED NONE DETECTED   Tetrahydrocannabinol NONE DETECTED NONE DETECTED   Barbiturates NONE DETECTED NONE DETECTED    Comment: (NOTE) DRUG SCREEN FOR MEDICAL PURPOSES ONLY.  IF CONFIRMATION IS NEEDED FOR ANY PURPOSE, NOTIFY LAB WITHIN 5 DAYS. LOWEST DETECTABLE LIMITS FOR URINE DRUG SCREEN Drug Class                     Cutoff (ng/mL) Amphetamine and metabolites    1000 Barbiturate and metabolites    200 Benzodiazepine                 948 Tricyclics and metabolites     300 Opiates and metabolites        300 Cocaine and metabolites        300 THC                            50 Performed at [facility], [address] [place], [place] [address]     Chemistries  Recent Labs  Lab 04/30/19 2335  NA 135  K 3.2*  CL 94*  CO2 27  GLUCOSE 171*  BUN 23*  CREATININE 1.21  CALCIUM 9.2  AST 43*  ALT 89*  ALKPHOS 49  BILITOT 1.2    ------------------------------------------------------------------------------------------------------------------  ------------------------------------------------------------------------------------------------------------------ GFR: Estimated Creatinine Clearance: 85 mL/min (by C-G formula based on SCr of 1.21 mg/dL). Liver Function Tests: Recent Labs  Lab 04/30/19 2335  AST 43*  ALT 89*  ALKPHOS 49  BILITOT 1.2  PROT 8.8*  ALBUMIN 3.9   CBG: Recent Labs  Lab 05/01/19 0000  GLUCAP 141*    --------------------------------------------------------------------------------------------------------------- Urine analysis:    Component Value Date/Time   COLORURINE AMBER (A) 05/01/2019 0021   APPEARANCEUR HAZY (A) 05/01/2019 0021   LABSPEC 1.030 05/01/2019 0021   PHURINE 6.0 05/01/2019 0021   GLUCOSEU 150 (A) 05/01/2019 0021   HGBUR MODERATE (A) 05/01/2019 0021   BILIRUBINUR NEGATIVE 05/01/2019 0021   KETONESUR 5 (A) 05/01/2019 0021   PROTEINUR 100 (A) 05/01/2019 0021   NITRITE NEGATIVE 05/01/2019 0021  LEUKOCYTESUR NEGATIVE 05/01/2019 0021      Imaging Results:    CT Head Wo Contrast  Result Date: 05/01/2019 CLINICAL DATA:  COVID-19 positive, altered mental status, unclear cause EXAM: CT HEAD WITHOUT CONTRAST TECHNIQUE: Contiguous axial images were obtained from the base of the skull through the vertex without intravenous contrast. COMPARISON:  None FINDINGS: Brain: No evidence of acute infarction, hemorrhage, hydrocephalus, extra-axial collection or mass lesion/mass effect. Vascular: No hyperdense vessel or unexpected calcification. Skull: No calvarial fracture or suspicious osseous lesion. No scalp swelling or hematoma. Sinuses/Orbits: Paranasal sinuses and mastoid air cells are predominantly clear. Included orbital structures are unremarkable. Other: None IMPRESSION: No acute cardiopulmonary abnormality. Electronically Signed   By: Lovena Le M.D.   On: 05/01/2019  01:45   DG Chest Port 1 View  Result Date: 04/30/2019 CLINICAL DATA:  Altered mental status, COVID-19 positivity EXAM: PORTABLE CHEST 1 VIEW COMPARISON:  04/24/2019 FINDINGS: Cardiac shadow is within normal limits. Increasing patchy airspace opacities are noted when compared with the prior exam consistent with the given clinical history of COVID-19 positivity. No sizable effusion is seen. No bony abnormality is noted. IMPRESSION: Increasing patchy opacities bilaterally consistent with the given clinical history. Electronically Signed   By: Inez Catalina M.D.   On: 04/30/2019 23:46    My personal review of EKG: Rhythm NSR, no ST changes   Assessment & Plan:    Active Problems:   Pneumonia due to COVID-19 virus   1. Altered mental status-likely due to COVID-19 infection, also prednisone and Tessalon Perles can cause hallucinations.  Patient has not slept in 5 days.  CT head is unremarkable.  Patient received Ativan in the ED.  Has mildly abnormal UA with hematuria.  Will start IV ceftriaxone.  Follow urine culture results.  If no improvement in next 12 hours, consider lumbar puncture to rule out viral meningitis.  I doubt patient has bacterial meningitis.  2. COVID-19 pneumonia-chest x-ray shows increased patchy opacities bilaterally, he is currently not requiring oxygen.  O2 sats 100% on room air.  Will start remdesivir per pharmacy consultation.  Would avoid giving Decadron considering confusion with prednisone as above.  3. UTI-patient's UA shows hematuria, started on IV Rocephin.  Follow urine culture results.    DVT Prophylaxis-   Lovenox   AM Labs Ordered, also please review Full Orders  Family Communication: Admission, patients condition and plan of care including tests being ordered have been discussed with the patients significant other who indicate understanding and agree with the plan and Code Status.  Code Status: Full code  Admission status: Inpatient: Based on patients  clinical presentation and evaluation of above clinical data, I have made determination that patient meets Inpatient criteria at this time.  Time spent in minutes : 60 min   Miri Jose S Esther Bradstreet M.D

## 2019-05-02 DIAGNOSIS — G9340 Encephalopathy, unspecified: Secondary | ICD-10-CM

## 2019-05-02 DIAGNOSIS — E860 Dehydration: Secondary | ICD-10-CM | POA: Diagnosis present

## 2019-05-02 HISTORY — DX: Encephalopathy, unspecified: G93.40

## 2019-05-02 HISTORY — DX: Dehydration: E86.0

## 2019-05-02 LAB — COMPREHENSIVE METABOLIC PANEL
ALT: 62 U/L — ABNORMAL HIGH (ref 0–44)
AST: 33 U/L (ref 15–41)
Albumin: 2.7 g/dL — ABNORMAL LOW (ref 3.5–5.0)
Alkaline Phosphatase: 37 U/L — ABNORMAL LOW (ref 38–126)
Anion gap: 7 (ref 5–15)
BUN: 18 mg/dL (ref 6–20)
CO2: 25 mmol/L (ref 22–32)
Calcium: 8.6 mg/dL — ABNORMAL LOW (ref 8.9–10.3)
Chloride: 103 mmol/L (ref 98–111)
Creatinine, Ser: 1.11 mg/dL (ref 0.61–1.24)
GFR calc Af Amer: >60 mL/min (ref >60)
GFR calc non Af Amer: >60 mL/min (ref >60)
Glucose, Bld: 100 mg/dL — ABNORMAL HIGH (ref 70–99)
Potassium: 4.2 mmol/L (ref 3.5–5.1)
Sodium: 135 mmol/L (ref 135–145)
Total Bilirubin: 0.9 mg/dL (ref 0.3–1.2)
Total Protein: 6.2 g/dL — ABNORMAL LOW (ref 6.5–8.1)

## 2019-05-02 LAB — CBC WITH DIFFERENTIAL/PLATELET
Abs Immature Granulocytes: 0.07 10*3/uL (ref 0.00–0.07)
Basophils Absolute: 0 10*3/uL (ref 0.0–0.1)
Basophils Relative: 1 %
Eosinophils Absolute: 0.1 10*3/uL (ref 0.0–0.5)
Eosinophils Relative: 2 %
HCT: 40.2 % (ref 39.0–52.0)
Hemoglobin: 12.9 g/dL — ABNORMAL LOW (ref 13.0–17.0)
Immature Granulocytes: 1 %
Lymphocytes Relative: 19 %
Lymphs Abs: 1 10*3/uL (ref 0.7–4.0)
MCH: 26.9 pg (ref 26.0–34.0)
MCHC: 32.1 g/dL (ref 30.0–36.0)
MCV: 83.8 fL (ref 80.0–100.0)
Monocytes Absolute: 0.7 10*3/uL (ref 0.1–1.0)
Monocytes Relative: 12 %
Neutro Abs: 3.5 10*3/uL (ref 1.7–7.7)
Neutrophils Relative %: 65 %
Platelets: 267 10*3/uL (ref 150–400)
RBC: 4.8 MIL/uL (ref 4.22–5.81)
RDW: 12.6 % (ref 11.5–15.5)
WBC: 5.4 10*3/uL (ref 4.0–10.5)
nRBC: 0 % (ref 0.0–0.2)

## 2019-05-02 LAB — URINE CULTURE: Culture: NO GROWTH

## 2019-05-02 LAB — D-DIMER, QUANTITATIVE: D-Dimer, Quant: 0.62 ug/mL-FEU — ABNORMAL HIGH (ref 0.00–0.50)

## 2019-05-02 LAB — C-REACTIVE PROTEIN: CRP: 0.9 mg/dL (ref <1.0)

## 2019-05-02 LAB — FERRITIN: Ferritin: 458 ng/mL — ABNORMAL HIGH (ref 24–336)

## 2019-05-02 MED ORDER — INFLUENZA VAC SPLIT QUAD 0.5 ML IM SUSY: 0.5000 mL | PREFILLED_SYRINGE | INTRAMUSCULAR | Status: DC

## 2019-05-02 MED ORDER — ALPRAZOLAM 0.5 MG PO TABS
0.5000 mg | ORAL_TABLET | Freq: Three times a day (TID) | ORAL | Status: DC | PRN
  Administered 2019-05-02 (×2): 0.5 mg via ORAL
  Filled 2019-05-02 (×2): qty 1

## 2019-05-02 NOTE — Progress Notes (Signed)
PROGRESS NOTE    Benjamin Robinson  QIH:[id number] DOB: 04/11/1971 DOA: 04/30/2019 PCP: [facility]    Brief Narrative:  49 year old with a recent diagnosis of COVID-19 infection who was discharged on prednisone taper and Tessalon Perles, was brought back to the hospital with abnormal behavior, hallucinations and delusions.  Initial work-up indicated mild dehydration.  CT head was negative, urine toxicology screen negative.  Vitals are otherwise stable.  He was admitted for further treatments.   Assessment & Plan:   Active Problems:   Pneumonia due to COVID-19 virus   Acute encephalopathy   Dehydration   1. Acute encephalopathy.  Etiology is unclear.  CT head was negative on admission.  Urine toxicology screen is also negative.  Urine culture did not show any growth.  Vitals seem otherwise stable.  No significant leukocytosis.  He was recently taking prednisone, question steroid related psychosis.  He was initially having hallucinations which appear to have improved.  Currently, mental status is not back to baseline since he is slow to respond.  Appears to be very anxious during my visit today.  We will try low-dose Xanax.  If mental status does not improve, may need to consider further imaging. 2. Recent Covid 19 pneumonia.  Chest x-ray shows bilateral infiltrates consistent with pneumonia.  He does not have any significant hypoxia.  He has been started on remdesivir. 3. Dehydration.  Improved with IV hydration.   DVT prophylaxis: Lovenox Code Status: Full code Family Communication: Discussed with significant other Disposition Plan: Discharge home once mental status is improved   Consultants:     Procedures:     Antimicrobials:   Rocephin 1/8 > 1/9  Remdesivir 1/8>   Subjective: Patient is still slow to respond.  Does not appear to be having hallucinations, but his girlfriend feels that he is still not back to his baseline.  During my visit today, he does  complain of shortness of breath, with symptoms appear to be out of proportion to objective findings.  Objective: Vitals:   05/01/19 2051 05/02/19 0529 05/02/19 0530 05/02/19 1300  BP:  121/75  127/83  Pulse:  79 83 79  Resp:  20    Temp:  98.1 F (36.7 C)  97.9 F (36.6 C)  TempSrc:  Oral  Oral  SpO2: 100%  99% 100%  Weight:      Height:        Intake/Output Summary (Last 24 hours) at 05/02/2019 2020 Last data filed at 05/02/2019 1735 Gross per 24 hour  Intake 3468.5 ml  Output --  Net 3468.5 ml   Filed Weights   04/30/19 2306  Weight: 93 kg    Examination:  General exam: Appears to be resting comfortably on my arrival, but after we started a conversation, patient complains of significant shortness of breath Respiratory system: Clear to auscultation. Respiratory effort normal. Cardiovascular system: S1 & S2 heard, RRR. No JVD, murmurs, rubs, gallops or clicks. No pedal edema. Gastrointestinal system: Abdomen is nondistended, soft and nontender. No organomegaly or masses felt. Normal bowel sounds heard. Central nervous system: Alert and oriented. No focal neurological deficits. Extremities: Symmetric 5 x 5 power. Skin: No rashes, lesions or ulcers     Data Reviewed: I have personally reviewed following labs and imaging studies  CBC: Recent Labs  Lab 04/30/19 2335 05/02/19 0557  WBC 4.9 5.4  NEUTROABS 3.3 3.5  HGB 17.3* 12.9*  HCT 53.5* 40.2  MCV 82.1 83.8  PLT 281 875   Basic Metabolic Panel:  Recent Labs  Lab 04/30/19 2335 05/02/19 0557  NA 135 135  K 3.2* 4.2  CL 94* 103  CO2 27 25  GLUCOSE 171* 100*  BUN 23* 18  CREATININE 1.21 1.11  CALCIUM 9.2 8.6*   GFR: Estimated Creatinine Clearance: 92.6 mL/min (by C-G formula based on SCr of 1.11 mg/dL). Liver Function Tests: Recent Labs  Lab 04/30/19 2335 05/02/19 0557  AST 43* 33  ALT 89* 62*  ALKPHOS 49 37*  BILITOT 1.2 0.9  PROT 8.8* 6.2*  ALBUMIN 3.9 2.7*   No results for input(s): LIPASE,  AMYLASE in the last 168 hours. No results for input(s): AMMONIA in the last 168 hours. Coagulation Profile: No results for input(s): INR, PROTIME in the last 168 hours. Cardiac Enzymes: No results for input(s): CKTOTAL, CKMB, CKMBINDEX, TROPONINI in the last 168 hours. BNP (last 3 results) No results for input(s): PROBNP in the last 8760 hours. HbA1C: No results for input(s): HGBA1C in the last 72 hours. CBG: Recent Labs  Lab 05/01/19 0000  GLUCAP 141*   Lipid Profile: No results for input(s): CHOL, HDL, LDLCALC, TRIG, CHOLHDL, LDLDIRECT in the last 72 hours. Thyroid Function Tests: No results for input(s): TSH, T4TOTAL, FREET4, T3FREE, THYROIDAB in the last 72 hours. Anemia Panel: Recent Labs    05/02/19 0557  FERRITIN 458*   Sepsis Labs: Recent Labs  Lab 04/30/19 2335  LATICACIDVEN 1.7    Recent Results (from the past 240 hour(s))  Novel Coronavirus, NAA (Hosp order, Send-out to Ref Lab; TAT 18-24 hrs     Status: Abnormal   Collection Time: 04/24/19 12:22 PM   Specimen: Nasopharyngeal Swab; Respiratory  Result Value Ref Range Status   SARS-CoV-2, NAA DETECTED (A) NOT DETECTED Final    Comment: (NOTE)                  Client Requested Flag This nucleic acid amplification test was developed and its performance characteristics determined by [employer]. Nucleic acid amplification tests include PCR and TMA. This test has not been FDA cleared or approved. This test has been authorized by FDA under an Emergency Use Authorization (EUA). This test is only authorized for the duration of time the declaration that circumstances exist justifying the authorization of the emergency use of in vitro diagnostic tests for detection of SARS-CoV-2 virus and/or diagnosis of COVID-19 infection under section 564(b)(1) of the Act, 21 U.S.C. 563OVF-6(E) (1), unless the authorization is terminated or revoked sooner. When diagnostic testing is negative, the possibility of a  false negative result should be considered in the context of a patient's recent exposures and the presence of clinical signs and symptoms consistent with COVID-19. An individual without symptoms of COVID-  19 and who is not shedding SARS-CoV-2 virus would expect to have a negative (not detected) result in this assay. Performed At: [facility] [address] [place], [place] [id number] Rush Farmer MD [id number]    [place]  Final    Comment: Performed at [facility], [address], [place], [place] [address]  Blood Cultures (routine x 2)     Status: None (Preliminary result)   Collection Time: 04/30/19 11:35 PM   Specimen: BLOOD  Result Value Ref Range Status   Specimen Description BLOOD RIGHT ANTECUBITAL  Final   Special Requests   Final    BOTTLES DRAWN AEROBIC AND ANAEROBIC Blood Culture adequate volume   Culture   Final    NO GROWTH 1 DAY Performed at [facility], [address] [place], [place] [address]    Report Status PENDING  Incomplete  Urine culture     Status: None   Collection Time: 05/01/19 12:21 AM   Specimen: Urine, Catheterized  Result Value Ref Range Status   Specimen Description   Final    URINE, CATHETERIZED Performed at [facility], [address] [place], [place] [address]    Special Requests   Final    NONE Performed at [facility], [address] [place], [place] [address]    Culture   Final    NO GROWTH Performed at [facility], [address], [place], [place] [address]    Report Status 05/02/2019 FINAL  Final  Blood Cultures (routine x 2)     Status: None (Preliminary result)   Collection Time: 05/01/19 12:42 AM   Specimen: BLOOD  Result Value Ref Range Status   Specimen Description BLOOD LEFT ANTECUBITAL  Final   Special Requests   Final    BOTTLES DRAWN AEROBIC AND ANAEROBIC Blood Culture adequate volume   Culture   Final    NO GROWTH 1 DAY Performed at [facility], [address]  [place],  [address]    Report Status PENDING  Incomplete         Radiology Studies: CT Head Wo Contrast  Result Date: 05/01/2019 CLINICAL DATA:  COVID-19 positive, altered mental status, unclear cause EXAM: CT HEAD WITHOUT CONTRAST TECHNIQUE: Contiguous axial images were obtained from the base of the skull through the vertex without intravenous contrast. COMPARISON:  None FINDINGS: Brain: No evidence of acute infarction, hemorrhage, hydrocephalus, extra-axial collection or mass lesion/mass effect. Vascular: No hyperdense vessel or unexpected calcification. Skull: No calvarial fracture or suspicious osseous lesion. No scalp swelling or hematoma. Sinuses/Orbits: Paranasal sinuses and mastoid air cells are predominantly clear. Included orbital structures are unremarkable. Other: None IMPRESSION: No acute cardiopulmonary abnormality. Electronically Signed   By: Lovena Le M.D.   On: 05/01/2019 01:45   DG Chest Port 1 View  Result Date: 04/30/2019 CLINICAL DATA:  Altered mental status, COVID-19 positivity EXAM: PORTABLE CHEST 1 VIEW COMPARISON:  04/24/2019 FINDINGS: Cardiac shadow is within normal limits. Increasing patchy airspace opacities are noted when compared with the prior exam consistent with the given clinical history of COVID-19 positivity. No sizable effusion is seen. No bony abnormality is noted. IMPRESSION: Increasing patchy opacities bilaterally consistent with the given clinical history. Electronically Signed   By: Inez Catalina M.D.   On: 04/30/2019 23:46        Scheduled Meds: . enoxaparin (LOVENOX) injection  40 mg Subcutaneous Q24H  . [START ON 05/03/2019] influenza vac split quadrivalent PF  0.5 mL Intramuscular Tomorrow-1000   Continuous Infusions: . remdesivir 100 mg in NS 100 mL 100 mg (05/02/19 0956)     LOS: 1 day    Time spent: 35 minutes    Kathie Dike, MD [facility]   If 7PM-7AM, please contact night-coverage [email or web address]  05/02/2019, 8:20  PM

## 2019-05-02 NOTE — Plan of Care (Signed)

## 2019-05-03 LAB — FERRITIN: Ferritin: 431 ng/mL — ABNORMAL HIGH (ref 24–336)

## 2019-05-03 LAB — CBC WITH DIFFERENTIAL/PLATELET
Abs Immature Granulocytes: 0.09 10*3/uL — ABNORMAL HIGH (ref 0.00–0.07)
Basophils Absolute: 0 10*3/uL (ref 0.0–0.1)
Basophils Relative: 1 %
Eosinophils Absolute: 0.1 10*3/uL (ref 0.0–0.5)
Eosinophils Relative: 3 %
HCT: 41 % (ref 39.0–52.0)
Hemoglobin: 13 g/dL (ref 13.0–17.0)
Immature Granulocytes: 2 %
Lymphocytes Relative: 23 %
Lymphs Abs: 0.9 10*3/uL (ref 0.7–4.0)
MCH: 26.5 pg (ref 26.0–34.0)
MCHC: 31.7 g/dL (ref 30.0–36.0)
MCV: 83.7 fL (ref 80.0–100.0)
Monocytes Absolute: 0.5 10*3/uL (ref 0.1–1.0)
Monocytes Relative: 14 %
Neutro Abs: 2.2 10*3/uL (ref 1.7–7.7)
Neutrophils Relative %: 57 %
Platelets: 289 10*3/uL (ref 150–400)
RBC: 4.9 MIL/uL (ref 4.22–5.81)
RDW: 12.8 % (ref 11.5–15.5)
WBC: 3.9 10*3/uL — ABNORMAL LOW (ref 4.0–10.5)
nRBC: 0 % (ref 0.0–0.2)

## 2019-05-03 LAB — COMPREHENSIVE METABOLIC PANEL
ALT: 60 U/L — ABNORMAL HIGH (ref 0–44)
AST: 34 U/L (ref 15–41)
Albumin: 2.6 g/dL — ABNORMAL LOW (ref 3.5–5.0)
Alkaline Phosphatase: 42 U/L (ref 38–126)
Anion gap: 8 (ref 5–15)
BUN: 17 mg/dL (ref 6–20)
CO2: 26 mmol/L (ref 22–32)
Calcium: 8.5 mg/dL — ABNORMAL LOW (ref 8.9–10.3)
Chloride: 103 mmol/L (ref 98–111)
Creatinine, Ser: 1.12 mg/dL (ref 0.61–1.24)
GFR calc Af Amer: >60 mL/min (ref >60)
GFR calc non Af Amer: >60 mL/min (ref >60)
Glucose, Bld: 99 mg/dL (ref 70–99)
Potassium: 3.9 mmol/L (ref 3.5–5.1)
Sodium: 137 mmol/L (ref 135–145)
Total Bilirubin: 0.9 mg/dL (ref 0.3–1.2)
Total Protein: 6.1 g/dL — ABNORMAL LOW (ref 6.5–8.1)

## 2019-05-03 LAB — C-REACTIVE PROTEIN: CRP: 1.2 mg/dL — ABNORMAL HIGH (ref <1.0)

## 2019-05-03 LAB — D-DIMER, QUANTITATIVE: D-Dimer, Quant: 0.91 ug/mL-FEU — ABNORMAL HIGH (ref 0.00–0.50)

## 2019-05-03 NOTE — Progress Notes (Signed)
Nsg Discharge Note  Admit Date:  04/30/2019 Discharge date: 05/03/2019   Everlene Balls to be D/C'd Home per MD order.  AVS completed.  Copy for chart, and copy for patient signed, and dated. Patient/caregiver able to verbalize understanding.  Discharge Medication: Allergies as of 05/03/2019      Reactions   Benzonatate    Spouse feels that this medication or Prednisone may have causes hallucinations   Prednisone Other (See Comments)   Patient feels that this medication or Benzonatate causes hallucinations       Medication List    STOP taking these medications   predniSONE 10 MG (21) Tbpk tablet Commonly known as: STERAPRED UNI-PAK 21 TAB       Discharge Assessment: Vitals:   05/03/19 0525 05/03/19 1440  BP: 130/90 102/88  Pulse: 69 75  Resp: 20 18  Temp: 98 F (36.7 C) 98.1 F (36.7 C)  SpO2: 97% 100%   Skin clean, dry and intact without evidence of skin break down, no evidence of skin tears noted. IV catheter discontinued intact. Site without signs and symptoms of complications - no redness or edema noted at insertion site, patient denies c/o pain - only slight tenderness at site.  Dressing with slight pressure applied.  D/c Instructions-Education: Discharge instructions given to patient/family with verbalized understanding. D/c education completed with patient/family including follow up instructions, medication list, d/c activities limitations if indicated, with other d/c instructions as indicated by MD - patient able to verbalize understanding, all questions fully answered. Patient instructed to return to ED, call 911, or call MD for any changes in condition.  Patient escorted via [place], and D/C home via private auto.  Santa Lighter, RN 05/03/2019 6:26 PM

## 2019-05-03 NOTE — Progress Notes (Signed)
Talked with girlfriend, Aloha Gell, on the phone and gave her an update on patient's condition.

## 2019-05-03 NOTE — Plan of Care (Signed)
  Problem: Education: Goal: Knowledge of General Education information will improve Description: Including pain rating scale, medication(s)/side effects and non-pharmacologic comfort measures 05/03/2019 1640 by Santa Lighter, RN Outcome: Adequate for Discharge 05/03/2019 1155 by Santa Lighter, RN Outcome: Progressing   Problem: Health Behavior/Discharge Planning: Goal: Ability to manage health-related needs will improve 05/03/2019 1640 by Santa Lighter, RN Outcome: Adequate for Discharge 05/03/2019 1155 by Santa Lighter, RN Outcome: Progressing   Problem: Clinical Measurements: Goal: Ability to maintain clinical measurements within normal limits will improve Outcome: Adequate for Discharge Goal: Will remain free from infection Outcome: Adequate for Discharge Goal: Diagnostic test results will improve Outcome: Adequate for Discharge Goal: Respiratory complications will improve Outcome: Adequate for Discharge Goal: Cardiovascular complication will be avoided Outcome: Adequate for Discharge   Problem: Activity: Goal: Risk for activity intolerance will decrease Outcome: Adequate for Discharge   Problem: Nutrition: Goal: Adequate nutrition will be maintained Outcome: Adequate for Discharge   Problem: Coping: Goal: Level of anxiety will decrease Outcome: Adequate for Discharge   Problem: Elimination: Goal: Will not experience complications related to bowel motility Outcome: Adequate for Discharge Goal: Will not experience complications related to urinary retention Outcome: Adequate for Discharge   Problem: Pain Managment: Goal: General experience of comfort will improve Outcome: Adequate for Discharge   Problem: Safety: Goal: Ability to remain free from injury will improve Outcome: Adequate for Discharge   Problem: Skin Integrity: Goal: Risk for impaired skin integrity will decrease Outcome: Adequate for Discharge   Problem: Education: Goal: Knowledge of risk  factors and measures for prevention of condition will improve Outcome: Adequate for Discharge   Problem: Coping: Goal: Psychosocial and spiritual needs will be supported Outcome: Adequate for Discharge   Problem: Respiratory: Goal: Will maintain a patent airway Outcome: Adequate for Discharge Goal: Complications related to the disease process, condition or treatment will be avoided or minimized Outcome: Adequate for Discharge

## 2019-05-03 NOTE — Discharge Instructions (Signed)
Person Under Monitoring Name: Benjamin Robinson  Location: [address] [place] [address]   Infection Prevention Recommendations for Individuals Confirmed to have, or Being Evaluated for, 2019 Novel Coronavirus (COVID-19) Infection Who Receive Care at Home  Individuals who are confirmed to have, or are being evaluated for, COVID-19 should follow the prevention steps below until a healthcare provider or local or state health department says they can return to normal activities.  Stay home except to get medical care You should restrict activities outside your home, except for getting medical care. Do not go to work, school, or public areas, and do not use public transportation or taxis.  Call ahead before visiting your doctor Before your medical appointment, call the healthcare provider and tell them that you have, or are being evaluated for, COVID-19 infection. This will help the healthcare provider's office take steps to keep other people from getting infected. Ask your healthcare provider to call the local or state health department.  Monitor your symptoms Seek prompt medical attention if your illness is worsening (e.g., difficulty breathing). Before going to your medical appointment, call the healthcare provider and tell them that you have, or are being evaluated for, COVID-19 infection. Ask your healthcare provider to call the local or state health department.  Wear a facemask You should wear a facemask that covers your nose and mouth when you are in the same room with other people and when you visit a healthcare provider. People who live with or visit you should also wear a facemask while they are in the same room with you.  Separate yourself from other people in your home As much as possible, you should stay in a different room from other people in your home. Also, you should use a separate bathroom, if available.  Avoid sharing household items You should not share  dishes, drinking glasses, cups, eating utensils, towels, bedding, or other items with other people in your home. After using these items, you should wash them thoroughly with soap and water.  Cover your coughs and sneezes Cover your mouth and nose with a tissue when you cough or sneeze, or you can cough or sneeze into your sleeve. Throw used tissues in a lined trash can, and immediately wash your hands with soap and water for at least 20 seconds or use an alcohol-based hand rub.  Wash your [facility] your hands often and thoroughly with soap and water for at least 20 seconds. You can use an alcohol-based hand sanitizer if soap and water are not available and if your hands are not visibly dirty. Avoid touching your eyes, nose, and mouth with unwashed hands.   Prevention Steps for Caregivers and Household Members of Individuals Confirmed to have, or Being Evaluated for, COVID-19 Infection Being Cared for in the Home  If you live with, or provide care at home for, a person confirmed to have, or being evaluated for, COVID-19 infection please follow these guidelines to prevent infection:  Follow healthcare provider's instructions Make sure that you understand and can help the patient follow any healthcare provider instructions for all care.  Provide for the patient's basic needs You should help the patient with basic needs in the home and provide support for getting groceries, prescriptions, and other personal needs.  Monitor the patient's symptoms If they are getting sicker, call his or her medical provider and tell them that the patient has, or is being evaluated for, COVID-19 infection. This will help the healthcare provider's office take  steps to keep other people from getting infected. Ask the healthcare provider to call the local or state health department.  Limit the number of people who have contact with the patient  If possible, have only one caregiver for the patient.  Other  household members should stay in another home or place of residence. If this is not possible, they should stay  in another room, or be separated from the patient as much as possible. Use a separate bathroom, if available.  Restrict visitors who do not have an essential need to be in the home.  Keep older adults, very young children, and other sick people away from the patient Keep older adults, very young children, and those who have compromised immune systems or chronic health conditions away from the patient. This includes people with chronic heart, lung, or kidney conditions, diabetes, and cancer.  Ensure good ventilation Make sure that shared spaces in the home have good air flow, such as from an air conditioner or an opened window, weather permitting.  Wash your hands often  Wash your hands often and thoroughly with soap and water for at least 20 seconds. You can use an alcohol based hand sanitizer if soap and water are not available and if your hands are not visibly dirty.  Avoid touching your eyes, nose, and mouth with unwashed hands.  Use disposable paper towels to dry your hands. If not available, use dedicated cloth towels and replace them when they become wet.  Wear a facemask and gloves  Wear a disposable facemask at all times in the room and gloves when you touch or have contact with the patient's blood, body fluids, and/or secretions or excretions, such as sweat, saliva, sputum, nasal mucus, vomit, urine, or feces.  Ensure the mask fits over your nose and mouth tightly, and do not touch it during use.  Throw out disposable facemasks and gloves after using them. Do not reuse.  Wash your hands immediately after removing your facemask and gloves.  If your personal clothing becomes contaminated, carefully remove clothing and launder. Wash your hands after handling contaminated clothing.  Place all used disposable facemasks, gloves, and other waste in a lined container before  disposing them with other household waste.  Remove gloves and wash your hands immediately after handling these items.  Do not share dishes, glasses, or other household items with the patient  Avoid sharing household items. You should not share dishes, drinking glasses, cups, eating utensils, towels, bedding, or other items with a patient who is confirmed to have, or being evaluated for, COVID-19 infection.  After the person uses these items, you should wash them thoroughly with soap and water.  Wash laundry thoroughly  Immediately remove and wash clothes or bedding that have blood, body fluids, and/or secretions or excretions, such as sweat, saliva, sputum, nasal mucus, vomit, urine, or feces, on them.  Wear gloves when handling laundry from the patient.  Read and follow directions on labels of laundry or clothing items and detergent. In general, wash and dry with the warmest temperatures recommended on the label.  Clean all areas the individual has used often  Clean all touchable surfaces, such as counters, tabletops, doorknobs, bathroom fixtures, toilets, phones, keyboards, tablets, and bedside tables, every day. Also, clean any surfaces that may have blood, body fluids, and/or secretions or excretions on them.  Wear gloves when cleaning surfaces the patient has come in contact with.  Use a diluted bleach solution (e.g., dilute bleach with 1 part bleach  and 10 parts water) or a household disinfectant with a label that says EPA-registered for coronaviruses. To make a bleach solution at home, add 1 tablespoon of bleach to 1 quart (4 cups) of water. For a larger supply, add  cup of bleach to 1 gallon (16 cups) of water.  Read labels of cleaning products and follow recommendations provided on product labels. Labels contain instructions for safe and effective use of the cleaning product including precautions you should take when applying the product, such as wearing gloves or eye protection  and making sure you have good ventilation during use of the product.  Remove gloves and wash hands immediately after cleaning.  Monitor yourself for signs and symptoms of illness Caregivers and household members are considered close contacts, should monitor their health, and will be asked to limit movement outside of the home to the extent possible. Follow the monitoring steps for close contacts listed on the symptom monitoring form.   ? If you have additional questions, contact your local health department or call the epidemiologist on call at [phone] (available 24/7). ? This guidance is subject to change. For the most up-to-date guidance from [facility], please refer to their website: [email or web address]

## 2019-05-03 NOTE — Discharge Summary (Signed)
Physician Discharge Summary  Benjamin Robinson GUY:[id number] DOB: 05/19/1970 DOA: 04/30/2019  PCP: [facility]  Admit date: 04/30/2019 Discharge date: 05/03/2019  Admitted From: Home Disposition: Home  Recommendations for Outpatient Follow-up:  1. Follow up with PCP in 1-2 weeks 2. Please obtain BMP/CBC in one week  Discharge Condition: Stable CODE STATUS: Full code Diet recommendation: Regular diet  Brief/Interim Summary: 49 year old male with a recent diagnosis of COVID-19 who was discharged home on a prednisone taper and Tessalon Perles, was brought back to the hospital with abnormal behavior, hallucinations and delusions.  His initial work-up indicated mild dehydration.  CT head was found to be negative.  Urine toxicology screen also negative.  Vitals were otherwise stable.  He was admitted for further treatment  Discharge Diagnoses:  Active Problems:   Pneumonia due to COVID-19 virus   Acute encephalopathy   Dehydration  1. Acute encephalopathy.  Suspect this is related to recent steroid use.  He did not have any significant leukocytosis.  Urine culture did not show any growth.  CT head was negative urine toxicology screen was also unrevealing.  He did have some degree of dehydration which could also have been contributing.  The patient was hydrated with IV fluids and oral medications were held.  At this point, encephalopathy appears to have resolved he is back to his baseline mental status. 2. COVID-19 pneumonia.  Patient had a chest x-ray that showed bilateral infiltrates consistent with pneumonia.  He was initially started on remdesivir, but he is not hypoxic and inflammatory markers are not significantly elevated.  Do not feel that he needs to complete 5 days of antacid at this time.  He has been advised to monitor for any signs of worsening shortness of breath.  If he has any recurrence of fever, this should be treated supportively with Tylenol.  If he has any worsening  respiratory symptoms, he needs to come back to the hospital.  Currently, he appears to be breathing comfortably on room air.  Discharge Instructions  Discharge Instructions    Diet - low sodium heart healthy   Complete by: As directed    Increase activity slowly   Complete by: As directed      Allergies as of 05/03/2019      Reactions   Benzonatate    Spouse feels that this medication or Prednisone may have causes hallucinations   Prednisone Other (See Comments)   Patient feels that this medication or Benzonatate causes hallucinations       Medication List    STOP taking these medications   predniSONE 10 MG (21) Tbpk tablet Commonly known as: STERAPRED UNI-PAK 21 TAB       Allergies  Allergen Reactions  . Benzonatate     Spouse feels that this medication or Prednisone may have causes hallucinations  . Prednisone Other (See Comments)    Patient feels that this medication or Benzonatate causes hallucinations     Consultations:     Procedures/Studies: CT Head Wo Contrast  Result Date: 05/01/2019 CLINICAL DATA:  COVID-19 positive, altered mental status, unclear cause EXAM: CT HEAD WITHOUT CONTRAST TECHNIQUE: Contiguous axial images were obtained from the base of the skull through the vertex without intravenous contrast. COMPARISON:  None FINDINGS: Brain: No evidence of acute infarction, hemorrhage, hydrocephalus, extra-axial collection or mass lesion/mass effect. Vascular: No hyperdense vessel or unexpected calcification. Skull: No calvarial fracture or suspicious osseous lesion. No scalp swelling or hematoma. Sinuses/Orbits: Paranasal sinuses and mastoid air cells are predominantly clear. Included  orbital structures are unremarkable. Other: None IMPRESSION: No acute cardiopulmonary abnormality. Electronically Signed   By: Lovena Le M.D.   On: 05/01/2019 01:45   DG Chest Port 1 View  Result Date: 04/30/2019 CLINICAL DATA:  Altered mental status, COVID-19 positivity EXAM:  PORTABLE CHEST 1 VIEW COMPARISON:  04/24/2019 FINDINGS: Cardiac shadow is within normal limits. Increasing patchy airspace opacities are noted when compared with the prior exam consistent with the given clinical history of COVID-19 positivity. No sizable effusion is seen. No bony abnormality is noted. IMPRESSION: Increasing patchy opacities bilaterally consistent with the given clinical history. Electronically Signed   By: Inez Catalina M.D.   On: 04/30/2019 23:46   DG Chest Portable 1 View  Result Date: 04/24/2019 CLINICAL DATA:  Fever, body aches, diarrhea EXAM: PORTABLE CHEST 1 VIEW COMPARISON:  None. FINDINGS: The heart size and mediastinal contours are within normal limits. No atelectasis or airspace consolidation identified. Mild increase interstitial markings are noted in both lungs with a lower lung zone predominance. The visualized skeletal structures are unremarkable. IMPRESSION: 1. Mild nonspecific increased interstitial markings. Findings may reflect pulmonary vascular congestion versus early atypical inflammation/infection no airspace consolidation noted. Electronically Signed   By: Kerby Moors M.D.   On: 04/24/2019 13:03       Subjective: Patient knows he is in the hospital, he is feeling better, he is alert and oriented x3, feels like he is ready to go home.  Discharge Exam: Vitals:   05/02/19 1300 05/02/19 2124 05/03/19 0525 05/03/19 1440  BP: 127/83 (!) 127/95 130/90 102/88  Pulse: 79 90 69 75  Resp:  20 20 18   Temp: 97.9 F (36.6 C) 98.3 F (36.8 C) 98 F (36.7 C) 98.1 F (36.7 C)  TempSrc: Oral Oral Oral Oral  SpO2: 100% 100% 97% 100%  Weight:      Height:        General: Pt is alert, awake, not in acute distress Cardiovascular: RRR, S1/S2 +, no rubs, no gallops Respiratory: CTA bilaterally, no wheezing, no rhonchi Abdominal: Soft, NT, ND, bowel sounds + Extremities: no edema, no cyanosis    The results of significant diagnostics from this hospitalization  (including imaging, microbiology, ancillary and laboratory) are listed below for reference.     Microbiology: Recent Results (from the past 240 hour(s))  Novel Coronavirus, NAA (Hosp order, Send-out to Ref Lab; TAT 18-24 hrs     Status: Abnormal   Collection Time: 04/24/19 12:22 PM   Specimen: Nasopharyngeal Swab; Respiratory  Result Value Ref Range Status   SARS-CoV-2, NAA DETECTED (A) NOT DETECTED Final    Comment: (NOTE)                  Client Requested Flag This nucleic acid amplification test was developed and its performance characteristics determined by [employer]. Nucleic acid amplification tests include PCR and TMA. This test has not been FDA cleared or approved. This test has been authorized by FDA under an Emergency Use Authorization (EUA). This test is only authorized for the duration of time the declaration that circumstances exist justifying the authorization of the emergency use of in vitro diagnostic tests for detection of SARS-CoV-2 virus and/or diagnosis of COVID-19 infection under section 564(b)(1) of the Act, 21 U.S.C. 876OTL-5(B) (1), unless the authorization is terminated or revoked sooner. When diagnostic testing is negative, the possibility of a false negative result should be considered in the context of a patient's recent exposures and the presence of clinical signs and symptoms consistent  with COVID-19. An individual without symptoms of COVID-  19 and who is not shedding SARS-CoV-2 virus would expect to have a negative (not detected) result in this assay. Performed At: [facility] [address] [place], [place] [id number] Rush Farmer MD WE:[id number]    [place]  Final    Comment: Performed at [facility], [address] [place], [place] [address]  Blood Cultures (routine x 2)     Status: None (Preliminary result)   Collection Time: 04/30/19 11:35 PM   Specimen: BLOOD  Result Value Ref Range Status    Specimen Description BLOOD RIGHT ANTECUBITAL  Final   Special Requests   Final    BOTTLES DRAWN AEROBIC AND ANAEROBIC Blood Culture adequate volume   Culture   Final    NO GROWTH 1 DAY Performed at [facility], [address], [place], [place] [address]    Report Status PENDING  Incomplete  Urine culture     Status: None   Collection Time: 05/01/19 12:21 AM   Specimen: Urine, Catheterized  Result Value Ref Range Status   Specimen Description   Final    URINE, CATHETERIZED Performed at [facility], [address], [place], [place] [address]    Special Requests   Final    NONE Performed at [facility], [address], [place], [place] [address]    Culture   Final    NO GROWTH Performed at [facility], [place] [address] [place], [place] [address]    Report Status 05/02/2019 FINAL  Final  Blood Cultures (routine x 2)     Status: None (Preliminary result)   Collection Time: 05/01/19 12:42 AM   Specimen: BLOOD  Result Value Ref Range Status   Specimen Description BLOOD LEFT ANTECUBITAL  Final   Special Requests   Final    BOTTLES DRAWN AEROBIC AND ANAEROBIC Blood Culture adequate volume   Culture   Final    NO GROWTH 1 DAY Performed at [facility], [address] [place], [place] [address]    Report Status PENDING  Incomplete     Labs: BNP (last 3 results) No results for input(s): BNP in the last 8760 hours. Basic Metabolic Panel: Recent Labs  Lab 04/30/19 2335 05/02/19 0557 05/03/19 0731  NA 135 135 137  K 3.2* 4.2 3.9  CL 94* 103 103  CO2 27 25 26   GLUCOSE 171* 100* 99  BUN 23* 18 17  CREATININE 1.21 1.11 1.12  CALCIUM 9.2 8.6* 8.5*   Liver Function Tests: Recent Labs  Lab 04/30/19 2335 05/02/19 0557 05/03/19 0731  AST 43* 33 34  ALT 89* 62* 60*  ALKPHOS 49 37* 42  BILITOT 1.2 0.9 0.9  PROT 8.8* 6.2* 6.1*  ALBUMIN 3.9 2.7* 2.6*   No results for input(s): LIPASE, AMYLASE in the last 168 hours. No results for input(s): AMMONIA in the last 168  hours. CBC: Recent Labs  Lab 04/30/19 2335 05/02/19 0557 05/03/19 0731  WBC 4.9 5.4 3.9*  NEUTROABS 3.3 3.5 2.2  HGB 17.3* 12.9* 13.0  HCT 53.5* 40.2 41.0  MCV 82.1 83.8 83.7  PLT 281 267 289   Cardiac Enzymes: No results for input(s): CKTOTAL, CKMB, CKMBINDEX, TROPONINI in the last 168 hours. BNP: Invalid input(s): POCBNP CBG: Recent Labs  Lab 05/01/19 0000  GLUCAP 141*   D-Dimer Recent Labs    05/02/19 0557 05/03/19 0731  DDIMER 0.62* 0.91*   Hgb A1c No results for input(s): HGBA1C in the last 72 hours.  Lipid Profile No results for input(s): CHOL, HDL, LDLCALC, TRIG, CHOLHDL, LDLDIRECT in the last 72 hours. Thyroid function studies No results for input(s): TSH, T4TOTAL, T3FREE, THYROIDAB in the last 72 hours.  Invalid input(s): FREET3 Anemia work up Recent Labs    05/02/19 0557 05/03/19 0731  FERRITIN 458* 431*   Urinalysis    Component Value Date/Time   COLORURINE AMBER (A) 05/01/2019 0021   APPEARANCEUR HAZY (A) 05/01/2019 0021   LABSPEC 1.030 05/01/2019 0021   PHURINE 6.0 05/01/2019 0021   GLUCOSEU 150 (A) 05/01/2019 0021   HGBUR MODERATE (A) 05/01/2019 0021   BILIRUBINUR NEGATIVE 05/01/2019 0021   KETONESUR 5 (A) 05/01/2019 0021   PROTEINUR 100 (A) 05/01/2019 0021   NITRITE NEGATIVE 05/01/2019 0021   LEUKOCYTESUR NEGATIVE 05/01/2019 0021   Sepsis Labs Invalid input(s): PROCALCITONIN,  WBC,  LACTICIDVEN Microbiology Recent Results (from the past 240 hour(s))  Novel Coronavirus, NAA (Hosp order, Send-out to Ref Lab; TAT 18-24 hrs     Status: Abnormal   Collection Time: 04/24/19 12:22 PM   Specimen: Nasopharyngeal Swab; Respiratory  Result Value Ref Range Status   SARS-CoV-2, NAA DETECTED (A) NOT DETECTED Final    Comment: (NOTE)                  Client Requested Flag This nucleic acid amplification test was developed and its performance characteristics determined by [employer]. Nucleic acid amplification tests include PCR and  TMA. This test has not been FDA cleared or approved. This test has been authorized by FDA under an Emergency Use Authorization (EUA). This test is only authorized for the duration of time the declaration that circumstances exist justifying the authorization of the emergency use of in vitro diagnostic tests for detection of SARS-CoV-2 virus and/or diagnosis of COVID-19 infection under section 564(b)(1) of the Act, 21 U.S.C. 761YWV-3(X) (1), unless the authorization is terminated or revoked sooner. When diagnostic testing is negative, the possibility of a false negative result should be considered in the context of a patient's recent exposures and the presence of clinical signs and symptoms consistent with COVID-19. An individual without symptoms of COVID-  19 and who is not shedding SARS-CoV-2 virus would expect to have a negative (not detected) result in this assay. Performed At: [facility] [address] [place], [place] [id number] Rush Farmer MD IO:[id number]    [place]  Final    Comment: Performed at [facility], [address] [place], [place] [address]  Blood Cultures (routine x 2)     Status: None (Preliminary result)   Collection Time: 04/30/19 11:35 PM   Specimen: BLOOD  Result Value Ref Range Status   Specimen Description BLOOD RIGHT ANTECUBITAL  Final   Special Requests   Final    BOTTLES DRAWN AEROBIC AND ANAEROBIC Blood Culture adequate volume   Culture   Final    NO GROWTH 1 DAY Performed at [facility], [address], [place], [place] [address]    Report Status PENDING  Incomplete  Urine culture     Status: None   Collection Time: 05/01/19 12:21 AM   Specimen: Urine, Catheterized  Result Value Ref Range Status   Specimen Description   Final    URINE, CATHETERIZED Performed at [facility], [address] [place], [place] [address]    Special Requests   Final    NONE Performed at [facility], [address],  [place], [place] [address]    Culture   Final  NO GROWTH Performed at [facility], [place] [address] [place], [place] [address]    Report Status 05/02/2019 FINAL  Final  Blood Cultures (routine x 2)     Status: None (Preliminary result)   Collection Time: 05/01/19 12:42 AM   Specimen: BLOOD  Result Value Ref Range Status   Specimen Description BLOOD LEFT ANTECUBITAL  Final   Special Requests   Final    BOTTLES DRAWN AEROBIC AND ANAEROBIC Blood Culture adequate volume   Culture   Final    NO GROWTH 1 DAY Performed at [facility], [address], [place], [place] [address]    Report Status PENDING  Incomplete     Time coordinating discharge: 5mns  SIGNED:   JKathie Dike MD  [facility] 05/03/2019, 7:37 PM   If 7PM-7AM, please contact night-coverage [email or web address]

## 2019-05-03 NOTE — Plan of Care (Signed)
  Problem: Education: Goal: Knowledge of General Education information will improve Description Including pain rating scale, medication(s)/side effects and non-pharmacologic comfort measures Outcome: Progressing   Problem: Health Behavior/Discharge Planning: Goal: Ability to manage health-related needs will improve Outcome: Progressing   

## 2019-05-06 LAB — CULTURE, BLOOD (ROUTINE X 2)
Culture: NO GROWTH
Culture: NO GROWTH
Special Requests: ADEQUATE
Special Requests: ADEQUATE

## 2019-05-07 ENCOUNTER — Ambulatory Visit
Admission: EM | Admit: 2019-05-07 | Discharge: 2019-05-07 | Disposition: A | Discharge status: Home / Self Care | Payer: Self-pay | Attending: Emergency Medicine | Admitting: Emergency Medicine

## 2019-05-07 ENCOUNTER — Other Ambulatory Visit: Payer: Self-pay

## 2019-05-07 DIAGNOSIS — G479 Sleep disorder, unspecified: Secondary | ICD-10-CM

## 2019-05-07 DIAGNOSIS — Z8616 Personal history of COVID-19: Secondary | ICD-10-CM

## 2019-05-07 MED ORDER — HYDROXYZINE HCL 25 MG PO TABS: 25.0000 mg | ORAL_TABLET | Freq: Every evening | ORAL | 0 refills | Status: DC | PRN

## 2019-05-07 NOTE — Discharge Instructions (Addendum)
Rest and drink fluids Eat a well-balanced diet, and avoid excessive caffeine intake Hydroxyzine prescribed.  Take as directed for anxiety and to help you sleep.  DO NOT TAKE prior to driving or operating heavy machinery Some things you may try doing to help alleviate your symptoms include: keeping a journal, exercise, talking to a friend or relative, listening to music, going for a walk or hike outside, or other activities that you may find enjoyable Recommending further evaluation and management with PCP Return or go to ER if you have any new or worsening symptoms such as fever, chills, fatigue, worsening shortness of breath, wheezing, chest pain, nausea, vomiting, abdominal pain, changes in bowel or bladder habits, persistent symptoms despite medication, etc..Marland Kitchen

## 2019-05-07 NOTE — ED Provider Notes (Addendum)
[place]   [id number] 05/07/19 Arrival Time: 0806  CC: Trouble sleeping  SUBJECTIVE: HPI: obtained from patient and fiance  Benjamin Robinson is a 49 y.o. male who complains of trouble sleeping for the past "few days." Diagnosed with COVID on 04/24/19.  Fiance states he has been acting abnormal since he was diagnosed with COVID.  "Talking out of his head." She brought him back to the ED and he was admited to the hospital on 04/30/19 for encephalopathy, and hallucinations, (secondary to prednisone use per hospitalist note) following COVID infection.  Instructed patient to follow up with PCP.  Since being discharged he has been having difficulty sleeping.  States he intermittently dozes off and then wakes back up after 30 minutes.  Has tried OTC melatonin for sleep without relief.  Denies similar symptoms in the past.  Denies HI or SI.  Patient denies fever, chills, anhedonia, hallucinations, changes in normal activities, nausea, vomiting, chest pain, SOB, abdominal pain, changes in bowel or bladder habits.    ROS: As per HPI.  All other pertinent ROS negative.     Past Medical History:  Diagnosis Date  . COVID-19    History reviewed. No pertinent surgical history. Allergies  Allergen Reactions  . Benzonatate     Spouse feels that this medication or Prednisone may have causes hallucinations  . Prednisone Other (See Comments)    Patient feels that this medication or Benzonatate causes hallucinations    No current facility-administered medications on file prior to encounter.   No current outpatient medications on file prior to encounter.   Social History   Socioeconomic History  . Marital status: Single    Spouse name: Not on file  . Number of children: Not on file  . Years of education: Not on file  . Highest education level: Not on file  Occupational History  . Not on file  Tobacco Use  . Smoking status: Never Smoker  . Smokeless tobacco: Never Used  Substance and Sexual  Activity  . Alcohol use: Never  . Drug use: Never  . Sexual activity: Not on file  Other Topics Concern  . Not on file  Social History Narrative  . Not on file   Social Determinants of Health   Financial Resource Strain:   . Difficulty of Paying Living Expenses: Not on file  Food Insecurity:   . Worried About Charity fundraiser in the Last Year: Not on file  . Ran Out of Food in the Last Year: Not on file  Transportation Needs:   . Lack of Transportation (Medical): Not on file  . Lack of Transportation (Non-Medical): Not on file  Physical Activity:   . Days of Exercise per Week: Not on file  . Minutes of Exercise per Session: Not on file  Stress:   . Feeling of Stress : Not on file  Social Connections:   . Frequency of Communication with Friends and Family: Not on file  . Frequency of Social Gatherings with Friends and Family: Not on file  . Attends Religious Services: Not on file  . Active Member of Clubs or Organizations: Not on file  . Attends Archivist Meetings: Not on file  . Marital Status: Not on file  Intimate Partner Violence:   . Fear of Current or Ex-Partner: Not on file  . Emotionally Abused: Not on file  . Physically Abused: Not on file  . Sexually Abused: Not on file   Family History  Problem Relation Age  of Onset  . Healthy Mother   . Healthy Father     OBJECTIVE:  Vitals:   05/07/19 0821  BP: 118/78  Pulse: (!) 110  Resp: 18  Temp: 98.7 F (37.1 C)  TempSrc: Oral  SpO2: 95%    General appearance: alert; appears fatigued Eyes: PERRLA; EOMI HENT: normocephalic; atraumatic; oropharynx clear Neck: supple with FROM Lungs: clear to auscultation bilaterally Heart: regular rate and rhythm.   Skin: warm and dry Neurologic: ambulates without difficulty; no slurred speech or facial droop; moves extremities without obvious deficiency  Psychological: cooperative; difficulty answering questions, when asked about having anxiety states he  does not know what anxiety is; cannot identify stressors   ASSESSMENT & PLAN:  1. History of COVID-19   2. Difficulty sleeping     Meds ordered this encounter  Medications  . hydrOXYzine (ATARAX/VISTARIL) 25 MG tablet    Sig: Take 1 tablet (25 mg total) by mouth at bedtime as needed for anxiety.    Dispense:  30 tablet    Refill:  0    Order Specific Question:   Supervising Provider    Answer:   Raylene Everts [[id number]]    Rest and drink fluids Eat a well-balanced diet, and avoid excessive caffeine intake Hydroxyzine prescribed.  Take as directed for anxiety and to help you sleep.  DO NOT TAKE prior to driving or operating heavy machinery Some things you may try doing to help alleviate your symptoms include: keeping a journal, exercise, talking to a friend or relative, listening to music, going for a walk or hike outside, or other activities that you may find enjoyable Recommending further evaluation and management with PCP Return or go to ER if you have any new or worsening symptoms such as fever, chills, fatigue, worsening shortness of breath, wheezing, chest pain, nausea, vomiting, abdominal pain, changes in bowel or bladder habits, etc...  Instructed wife to pick up medication and to give him a dose today.  If he was unable to go to sleep or get relief I encouraged her to take him back to the ED for further evaluation and management.    Reviewed expectations re: course of current medical issues. Questions answered. Outlined signs and symptoms indicating need for more acute intervention. Patient verbalized understanding. After Visit Summary given.   Lestine Box, PA-C 05/07/19 [place], [place], PA-C 05/07/19 [phone]

## 2019-05-07 NOTE — ED Triage Notes (Addendum)
Pt presents to UC w/ c/o sleep deprivation x "few days." PT c/o feeling weak. Pt would like something prescribed for sleep. Pt is oriented.

## 2019-12-29 ENCOUNTER — Other Ambulatory Visit: Payer: Self-pay

## 2020-07-04 ENCOUNTER — Other Ambulatory Visit: Payer: Self-pay

## 2020-07-04 ENCOUNTER — Encounter: Payer: Self-pay | Admitting: Nurse Practitioner

## 2020-07-04 ENCOUNTER — Ambulatory Visit: Payer: Self-pay | Admitting: Nurse Practitioner

## 2020-07-04 VITALS — BP 132/74 | HR 74 | Temp 98.2°F | Ht 69.0 in | Wt 212.0 lb

## 2020-07-04 DIAGNOSIS — D3142 Benign neoplasm of left ciliary body: Secondary | ICD-10-CM

## 2020-07-04 DIAGNOSIS — J302 Other seasonal allergic rhinitis: Secondary | ICD-10-CM | POA: Insufficient documentation

## 2020-07-04 DIAGNOSIS — Z0000 Encounter for general adult medical examination without abnormal findings: Secondary | ICD-10-CM

## 2020-07-04 DIAGNOSIS — Z7689 Persons encountering health services in other specified circumstances: Secondary | ICD-10-CM

## 2020-07-04 DIAGNOSIS — Z1211 Encounter for screening for malignant neoplasm of colon: Secondary | ICD-10-CM

## 2020-07-04 DIAGNOSIS — L2089 Other atopic dermatitis: Secondary | ICD-10-CM

## 2020-07-04 DIAGNOSIS — L209 Atopic dermatitis, unspecified: Secondary | ICD-10-CM | POA: Insufficient documentation

## 2020-07-04 DIAGNOSIS — Z0001 Encounter for general adult medical examination with abnormal findings: Secondary | ICD-10-CM

## 2020-07-04 NOTE — Progress Notes (Signed)
Subjective:    Patient ID: Benjamin Robinson, male    DOB: 11-21-1970, 50 y.o.   MRN: [id number]  HPI: Benjamin Robinson is a 50 y.o. male presenting for new patient visit to establish care.  Introduced to Designer, jewellery role and practice setting.  All questions answered.  Discussed provider/patient relationship and expectations.  Chief Complaint  Patient presents with  . Annual Exam    Has fx of dm, and cancer. Wants to be screened for chronic medical cond   RASH Duration:  weeks  Location: back of knees, elbows  Itching: yes Burning: no Redness: no Oozing: no Scaling: no Blisters: no Painful: no Fevers: yes Change in detergents/soaps/personal care products: none Recent illness: no Recent travel:no History of same: no Context: stable Alleviating factors: nothing Treatments attempted: none Shortness of breath: no  Throat/tongue swelling: no Myalgias/arthralgias: no  Drinks 4 bottles of water per day.  Stays physically active with his current job.  Requesting to be screened with blood work today to check for "anything that could be wrong."  Allergies  Allergen Reactions  . Benzonatate     Spouse feels that this medication or Prednisone may have causes hallucinations  . Prednisone Other (See Comments)    Patient feels that this medication or Benzonatate causes hallucinations     Outpatient Encounter Medications as of 07/04/2020  Medication Sig  . [DISCONTINUED] hydrOXYzine (ATARAX/VISTARIL) 25 MG tablet Take 1 tablet (25 mg total) by mouth at bedtime as needed for anxiety.   No facility-administered encounter medications on file as of 07/04/2020.    There are no problems to display for this patient.   Past Medical History:  Diagnosis Date  . Acute encephalopathy 05/02/2019  . COVID-19   . Dehydration 05/02/2019  . Pneumonia due to COVID-19 virus 05/01/2019   History reviewed. No pertinent surgical history.  Family History  Problem Relation Age of Onset  . Healthy  Daughter   . Healthy Son   . Diabetes Paternal Grandmother   . Cancer Paternal Grandfather        prostate   Social History   Tobacco Use  . Smoking status: Never Smoker  . Smokeless tobacco: Never Used  Vaping Use  . Vaping Use: Never used  Substance Use Topics  . Alcohol use: Never  . Drug use: Never   Review of Systems  Constitutional: Negative.  Negative for activity change, fatigue, fever and unexpected weight change.  HENT: Negative.   Eyes: Negative.   Respiratory: Negative.   Cardiovascular: Negative.   Gastrointestinal: Negative.  Negative for abdominal pain, anal bleeding, blood in stool, diarrhea, nausea and vomiting.  Genitourinary: Negative for decreased urine volume, difficulty urinating, dysuria and urgency.  Skin: Positive for rash. Negative for color change, pallor and wound.  Neurological: Negative.   Hematological: Negative.  Negative for adenopathy.  Psychiatric/Behavioral: Negative.    Per HPI unless specifically indicated above     Objective:    BP 132/74   Pulse 74   Temp 98.2 F (36.8 C)   Ht 5' 9"  (1.753 m)   Wt 212 lb (96.2 kg)   SpO2 97%   BMI 31.31 kg/m   Wt Readings from Last 3 Encounters:  07/04/20 212 lb (96.2 kg)  04/30/19 205 lb 0.4 oz (93 kg)  04/16/18 205 lb (93 kg)    Physical Exam Vitals and nursing note reviewed.  Constitutional:      General: He is not in acute distress.    Appearance: Normal appearance.  He is normal weight. He is not toxic-appearing.  HENT:     Head: Normocephalic and atraumatic.     Right Ear: Tympanic membrane, ear canal and external ear normal.     Left Ear: Tympanic membrane, ear canal and external ear normal.     Nose: Nose normal. No congestion.     Mouth/Throat:     Mouth: Mucous membranes are moist.     Pharynx: Oropharynx is clear. No oropharyngeal exudate or posterior oropharyngeal erythema.  Eyes:     General: No scleral icterus.       Right eye: No discharge.        Left eye: No  discharge.     Extraocular Movements: Extraocular movements intact.     Comments: Discolored area to left lateral eye consistent with nevi  Cardiovascular:     Rate and Rhythm: Normal rate and regular rhythm.     Heart sounds: Normal heart sounds. No murmur heard.   Pulmonary:     Effort: Pulmonary effort is normal. No respiratory distress.     Breath sounds: Normal breath sounds. No wheezing, rhonchi or rales.  Abdominal:     General: Abdomen is flat. Bowel sounds are normal. There is no distension.     Palpations: Abdomen is soft. There is no mass.     Tenderness: There is no abdominal tenderness. There is no right CVA tenderness or left CVA tenderness.  Musculoskeletal:        General: Normal range of motion.     Cervical back: Normal range of motion and neck supple. No tenderness.     Right lower leg: No edema.     Left lower leg: No edema.  Lymphadenopathy:     Cervical: No cervical adenopathy.  Skin:    General: Skin is warm and dry.     Capillary Refill: Capillary refill takes less than 2 seconds.     Coloration: Skin is not jaundiced or pale.     Findings: Rash (flesh-colored, papular, urticaric, rash noted to bilateral elbows, posterior knees) present. No erythema.  Neurological:     Mental Status: He is alert and oriented to person, place, and time.     Motor: No weakness.     Gait: Gait normal.  Psychiatric:        Mood and Affect: Mood normal.        Behavior: Behavior normal.        Thought Content: Thought content normal.        Judgment: Judgment normal.       Assessment & Plan:  1. Encounter to establish care  2. Annual physical exam Will check fasting labs today.  BP near goal and healthy BMI.  Encouraged drinking plenty of water - 64 oz total is goal.  Goal for physical activity is 30 minutes 5 times weekly.  Physical examination normal today.  Will follow up pending lab results.  - Lipid panel - Hemoglobin A1c - COMPLETE METABOLIC PANEL WITH GFR -  TSH - CBC with Differential  3. Flexural atopic dermatitis Noted to elbows, back of knees.  Start daily emollient like Vaseline or Aquaphor.  Return to clinic if symptoms persist - can start low dose steroid ointment.   4. Seasonal allergies Controlled.  Will notify if symptoms persist with use of OTC.  5. Scleral nevus of left eye Referral placed for Optometry.  - Ambulatory referral to Optometry  6. Colon cancer screening Will place referral to GI for screening colonoscopy.  Patient is self-pay and will likely need to inquire with GI regarding out of pocket cost.  - Ambulatory referral to Gastroenterology    Follow up plan: Return in about 1 year (around 07/04/2021) for CPE with fasting labs.

## 2020-07-04 NOTE — Patient Instructions (Addendum)
Benjamin Robinson,   Try Vaseline or Aquaphor for the itchy areas on your skin.   F/u in 1 year  Eczema Eczema refers to a group of skin conditions that cause skin to become rough and inflamed. Each type of eczema has different triggers, symptoms, and treatments. Eczema of any type is usually itchy. Symptoms range from mild to severe. Eczema is not spread from person to person (is not contagious). It can appear on different parts of the body at different times. One person's eczema may look different from another person's eczema. What are the causes? The exact cause of this condition is not known. However, exposure to certain environmental factors, irritants, and allergens can make the condition worse. What are the signs or symptoms? Symptoms of this condition depend on the type of eczema you have. The types include:  Contact dermatitis. There are two kinds: ? Irritant contact dermatitis. This happens when something irritates the skin and causes a rash. ? Allergic contact dermatitis. This happens when your skin comes in contact with something you are allergic to (allergens). This can include poison ivy, chemicals, or medicines that were applied to your skin.  Atopic dermatitis. This is a long-term (chronic) skin disease that keeps coming back (recurring). It is the most common type of eczema. Usual symptoms are a red rash and itchy, dry, scaly skin. It usually starts showing signs in infancy and can last through adulthood.  Dyshidrotic eczema. This is a form of eczema on the hands and feet. It shows up as very itchy, fluid-filled blisters. It can affect people of any age but is more common before age 39.  Hand eczema. This causes very itchy areas of skin on the palms and sides of the hands and fingers. This type of eczema is common in industrial jobs where you may be exposed to different types of irritants.  Lichen simplex chronicus. This type of eczema occurs when a person constantly scratches one area of  the body. Repeated scratching of the area leads to thickened skin (lichenification). This condition can accompany other types of eczema. It is more common in adults but may also be seen in children.  Nummular eczema. This is a common type of eczema that most often affects the lower legs and the backs of the hands. It typically causes an itchy, red, circular, crusty lesion (plaque). Scratching may become a habit and can cause bleeding. Nummular eczema occurs most often in middle-aged or older people.  Seborrheic dermatitis. This is a common skin disease that mainly affects the scalp. It may also affect other oily areas of the body, such as the face, sides of the nose, eyebrows, ears, eyelids, and chest. It is marked by small scaling and redness of the skin (erythema). This can affect people of all ages. In infants, this condition is called cradle cap.  Stasis dermatitis. This is a common skin disease that can cause itching, scaling, and hyperpigmentation, usually on the legs and feet. It occurs most often in people who have a condition that prevents blood from being pumped through the veins in the legs (chronic venous insufficiency). Stasis dermatitis is a chronic condition that needs long-term management.   How is this diagnosed? This condition may be diagnosed based on:  A physical exam of your skin.  Your medical history.  Skin patch tests. These tests involve using patches that contain possible allergens and placing them on your back. Your health care provider will check in a few days to see if an allergic  reaction occurred. How is this treated? Treatment for eczema is based on the type of eczema you have. You may be given hydrocortisone steroid medicine or antihistamines. These can relieve itching quickly and help reduce inflammation. These may be prescribed or purchased over the counter, depending on the strength that is needed. Follow these instructions at home:  Take or apply  over-the-counter and prescription medicines only as told by your health care provider.  Use creams or ointments to moisturize your skin. Do not use lotions.  Learn what triggers or irritates your symptoms so you can avoid these things.  Treat symptom flare-ups quickly.  Do not scratch your skin. This can make your rash worse.  Keep all follow-up visits. This is important. Where to find more information  American Academy of Dermatology: MemberVerification.ca  National Eczema Association: nationaleczema.org  The Society for Pediatric Dermatology: pedsderm.net Contact a health care provider if:  You have severe itching, even with treatment.  You scratch your skin regularly until it bleeds.  Your rash looks different than usual.  Your skin is painful, swollen, or more red than usual.  You have a fever. Summary  Eczema refers to a group of skin conditions that cause skin to become rough and inflamed. Each type has different triggers.  Eczema of any type causes itching that may range from mild to severe.  Treatment varies based on the type of eczema you have. Hydrocortisone steroid medicine or antihistamines can help with itching and inflammation.  Protecting your skin is the best way to prevent eczema. Use creams or ointments to moisturize your skin. Avoid triggers and irritants. Treat flare-ups quickly. This information is not intended to replace advice given to you by your health care provider. Make sure you discuss any questions you have with your health care provider. Document Revised: 01/18/2020 Document Reviewed: 01/18/2020 Elsevier Patient Education  2021 [employer].

## 2020-07-06 ENCOUNTER — Other Ambulatory Visit: Payer: Self-pay

## 2020-07-06 DIAGNOSIS — Z125 Encounter for screening for malignant neoplasm of prostate: Secondary | ICD-10-CM

## 2020-07-06 DIAGNOSIS — Z1211 Encounter for screening for malignant neoplasm of colon: Secondary | ICD-10-CM

## 2020-07-07 ENCOUNTER — Encounter: Payer: Self-pay | Admitting: Internal Medicine

## 2020-07-07 LAB — HEMOGLOBIN A1C
Hgb A1c MFr Bld: 5.7 % of total Hgb — ABNORMAL HIGH (ref <5.7)
Mean Plasma Glucose: 117 mg/dL
eAG (mmol/L): 6.5 mmol/L

## 2020-07-07 LAB — CBC WITH DIFFERENTIAL/PLATELET
Absolute Monocytes: 530 cells/uL (ref 200–950)
Basophils Absolute: 92 cells/uL (ref 0–200)
Basophils Relative: 1.8 %
Eosinophils Absolute: 219 cells/uL (ref 15–500)
Eosinophils Relative: 4.3 %
HCT: 46.8 % (ref 38.5–50.0)
Hemoglobin: 15.3 g/dL (ref 13.2–17.1)
Lymphs Abs: 1663 cells/uL (ref 850–3900)
MCH: 27.1 pg (ref 27.0–33.0)
MCHC: 32.7 g/dL (ref 32.0–36.0)
MCV: 83 fL (ref 80.0–100.0)
MPV: 10.4 fL (ref 7.5–12.5)
Monocytes Relative: 10.4 %
Neutro Abs: 2596 cells/uL (ref 1500–7800)
Neutrophils Relative %: 50.9 %
Platelets: 189 10*3/uL (ref 140–400)
RBC: 5.64 10*6/uL (ref 4.20–5.80)
RDW: 12.8 % (ref 11.0–15.0)
Total Lymphocyte: 32.6 %
WBC: 5.1 10*3/uL (ref 3.8–10.8)

## 2020-07-07 LAB — COMPLETE METABOLIC PANEL WITH GFR
AG Ratio: 1.5 (calc) (ref 1.0–2.5)
ALT: 27 U/L (ref 9–46)
AST: 23 U/L (ref 10–40)
Albumin: 4.4 g/dL (ref 3.6–5.1)
Alkaline phosphatase (APISO): 81 U/L (ref 36–130)
BUN: 22 mg/dL (ref 7–25)
CO2: 29 mmol/L (ref 20–32)
Calcium: 10.1 mg/dL (ref 8.6–10.3)
Chloride: 101 mmol/L (ref 98–110)
Creat: 1.27 mg/dL (ref 0.60–1.35)
GFR, Est African American: 77 mL/min/{1.73_m2} (ref >=60)
GFR, Est Non African American: 66 mL/min/{1.73_m2} (ref >=60)
Globulin: 2.9 g/dL (calc) (ref 1.9–3.7)
Glucose, Bld: 98 mg/dL (ref 65–99)
Potassium: 4.7 mmol/L (ref 3.5–5.3)
Sodium: 140 mmol/L (ref 135–146)
Total Bilirubin: 0.4 mg/dL (ref 0.2–1.2)
Total Protein: 7.3 g/dL (ref 6.1–8.1)

## 2020-07-07 LAB — TSH: TSH: 1.24 mIU/L (ref 0.40–4.50)

## 2020-07-07 LAB — LIPID PANEL
Cholesterol: 191 mg/dL (ref <200)
HDL: 50 mg/dL (ref >=40)
LDL Cholesterol (Calc): 126 mg/dL (calc) — ABNORMAL HIGH
Non-HDL Cholesterol (Calc): 141 mg/dL (calc) — ABNORMAL HIGH (ref <130)
Total CHOL/HDL Ratio: 3.8 (calc) (ref <5.0)
Triglycerides: 55 mg/dL (ref <150)

## 2020-07-07 LAB — TEST AUTHORIZATION

## 2020-07-07 LAB — PSA: PSA: 1.15 ng/mL (ref <=4.0)

## 2020-08-03 ENCOUNTER — Ambulatory Visit: Payer: Self-pay

## 2020-10-31 ENCOUNTER — Telehealth: Payer: Self-pay | Admitting: Nurse Practitioner

## 2020-10-31 NOTE — Telephone Encounter (Signed)
Patient came by asking if when he was in last if we did a blood test to check for blood clots. 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 He also states he hasn't received his colorguard test yet either.  CB# [phone] if you can't reach him you can call Lattie Haw at [phone]

## 2020-11-01 NOTE — Telephone Encounter (Signed)
Pt will need the next available appt. Regarding cologuard, pt did not have the insurance to pay for the testing. Already given information to get aid for testing kit.

## 2020-11-07 NOTE — Telephone Encounter (Signed)
I have left a vm asking pt to call and schedule an appt.

## 2020-12-04 NOTE — Progress Notes (Signed)
Subjective:    Patient ID: Marcellius Montagna, male    DOB: 11/16/1970, 50 y.o.   MRN: [id number]  HPI: Zaiden Ludlum is a 50 y.o. male presenting with fianc for abdominal pain.  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 eats food prepared by his fiance most of the time-examples are [place] and cheese sandwich, powerades, water, pringles, banana, crackers and peanut butter  Dysphagia: yes Odynophagia:  no Hematemesis: no Blood in stool: no EGD: no   Allergies  Allergen Reactions   Benzonatate     Spouse feels that this medication or Prednisone may have causes hallucinations   Prednisone Other (See Comments)    Patient feels that this medication or Benzonatate causes hallucinations     Outpatient Encounter Medications as of 12/05/2020  Medication Sig   omeprazole (PRILOSEC) 20 MG capsule Take 1 capsule (20 mg total) by mouth daily.   No facility-administered encounter medications on file as of 12/05/2020.    Patient Active Problem List   Diagnosis Date Noted   Seasonal allergies 07/04/2020   Atopic dermatitis 07/04/2020    Past Medical History:  Diagnosis Date   Acute encephalopathy 05/02/2019   COVID-19    Dehydration 05/02/2019   Pneumonia due to COVID-19 virus 05/01/2019    Relevant past medical, surgical, family and social history reviewed and updated as indicated.  Interim medical history since our last visit reviewed.  Review of Systems Per HPI unless specifically indicated above     Objective:    BP (!) 132/100   Pulse 60   Temp 98.7 F (37.1 C)   Ht 5' 9"  (1.753 m)   Wt 213 lb (96.6 kg)   SpO2 100%   BMI 31.45 kg/m   Wt Readings from Last 3 Encounters:  12/05/20 213 lb (96.6 kg)  07/04/20 212 lb (96.2 kg)  04/30/19 205 lb 0.4 oz (93 kg)    Physical Exam Vitals and nursing note reviewed.  Constitutional:      General: He is not in acute distress.    Appearance: He is well-developed. He is obese. He is not toxic-appearing.  HENT:     Head: Normocephalic and atraumatic.  Eyes:     General: No scleral icterus.    Extraocular Movements: Extraocular movements intact.  Abdominal:     General: Abdomen is flat. Bowel sounds are normal. There is no distension.     Palpations: Abdomen is soft.     Tenderness: There is abdominal tenderness in the periumbilical area. There is no right CVA tenderness, left CVA tenderness or rebound. Negative signs include Murphy's sign.     Hernia: No hernia is present.  Skin:    General: Skin is warm and dry.     Capillary Refill: Capillary refill takes less than 2 seconds.     Coloration: Skin is not cyanotic, jaundiced or pale.  Neurological:     Mental Status: He is alert and oriented to person, place, and time.  Psychiatric:  Mood and Affect: Mood normal. Mood is not anxious or depressed.        Behavior: Behavior normal.      Assessment & Plan:  1. Periumbilical abdominal pain Suspect acid reflux-symptoms consistent and examination benign today.  Will check urinalysis to rule out kidney stone.  Check blood counts to rule out acute infection.  Kidney liver function to rule out abdominal pathology.  Plan to start omeprazole 20 mg first thing the morning 30 minutes before meal for 8 weeks and follow-up at that time if it is helping.  If it is not helping, return to clinic.  Concern with  dysphagia-consider GI referral for possible EGD in future.  - Urinalysis, Routine w reflex microscopic - CBC with Differential - COMPLETE METABOLIC PANEL WITH GFR     Follow up plan: Return in about 8 weeks (around 01/30/2021), or if symptoms worsen or fail to improve.

## 2020-12-05 ENCOUNTER — Ambulatory Visit (INDEPENDENT_AMBULATORY_CARE_PROVIDER_SITE_OTHER): Payer: Self-pay | Admitting: Nurse Practitioner

## 2020-12-05 ENCOUNTER — Ambulatory Visit: Payer: Self-pay | Admitting: Nurse Practitioner

## 2020-12-05 ENCOUNTER — Other Ambulatory Visit: Payer: Self-pay

## 2020-12-05 VITALS — BP 132/100 | HR 60 | Temp 98.7°F | Ht 69.0 in | Wt 213.0 lb

## 2020-12-05 DIAGNOSIS — R1084 Generalized abdominal pain: Secondary | ICD-10-CM

## 2020-12-05 DIAGNOSIS — R1033 Periumbilical pain: Secondary | ICD-10-CM

## 2020-12-05 LAB — CBC WITH DIFFERENTIAL/PLATELET
Absolute Monocytes: 465 cells/uL (ref 200–950)
Basophils Absolute: 52 cells/uL (ref 0–200)
Basophils Relative: 1.1 %
Eosinophils Absolute: 169 cells/uL (ref 15–500)
Eosinophils Relative: 3.6 %
HCT: 46.2 % (ref 38.5–50.0)
Hemoglobin: 15.3 g/dL (ref 13.2–17.1)
Lymphs Abs: 1598 cells/uL (ref 850–3900)
MCH: 27.1 pg (ref 27.0–33.0)
MCHC: 33.1 g/dL (ref 32.0–36.0)
MCV: 81.8 fL (ref 80.0–100.0)
MPV: 10.7 fL (ref 7.5–12.5)
Monocytes Relative: 9.9 %
Neutro Abs: 2416 cells/uL (ref 1500–7800)
Neutrophils Relative %: 51.4 %
Platelets: 208 10*3/uL (ref 140–400)
RBC: 5.65 10*6/uL (ref 4.20–5.80)
RDW: 12.8 % (ref 11.0–15.0)
Total Lymphocyte: 34 %
WBC: 4.7 10*3/uL (ref 3.8–10.8)

## 2020-12-05 LAB — COMPLETE METABOLIC PANEL WITH GFR
AG Ratio: 1.5 (calc) (ref 1.0–2.5)
ALT: 27 U/L (ref 9–46)
AST: 20 U/L (ref 10–40)
Albumin: 4.3 g/dL (ref 3.6–5.1)
Alkaline phosphatase (APISO): 77 U/L (ref 36–130)
BUN: 15 mg/dL (ref 7–25)
CO2: 32 mmol/L (ref 20–32)
Calcium: 9.8 mg/dL (ref 8.6–10.3)
Chloride: 102 mmol/L (ref 98–110)
Creat: 1.11 mg/dL (ref 0.60–1.29)
Globulin: 2.9 g/dL (calc) (ref 1.9–3.7)
Glucose, Bld: 91 mg/dL (ref 65–99)
Potassium: 4.8 mmol/L (ref 3.5–5.3)
Sodium: 139 mmol/L (ref 135–146)
Total Bilirubin: 0.8 mg/dL (ref 0.2–1.2)
Total Protein: 7.2 g/dL (ref 6.1–8.1)
eGFR: 81 mL/min/{1.73_m2} (ref >=60)

## 2020-12-05 LAB — URINALYSIS, ROUTINE W REFLEX MICROSCOPIC
Bilirubin Urine: NEGATIVE
Glucose, UA: NEGATIVE
Hgb urine dipstick: NEGATIVE
Ketones, ur: NEGATIVE
Leukocytes,Ua: NEGATIVE
Nitrite: NEGATIVE
Protein, ur: NEGATIVE
Specific Gravity, Urine: 1.02 (ref 1.001–1.035)
pH: 7 (ref 5.0–8.0)

## 2020-12-05 MED ORDER — OMEPRAZOLE 20 MG PO CPDR: 20.0000 mg | DELAYED_RELEASE_CAPSULE | Freq: Every day | ORAL | 0 refills | Status: DC

## 2020-12-07 ENCOUNTER — Encounter: Payer: Self-pay | Admitting: Nurse Practitioner

## 2020-12-07 ENCOUNTER — Encounter: Payer: Self-pay | Admitting: *Deleted

## 2020-12-11 ENCOUNTER — Inpatient Hospital Stay (HOSPITAL_COMMUNITY)
Admission: EM | Admit: 2020-12-11 | Discharge: 2020-12-16 | DRG: 516 | Disposition: A | Discharge status: Home / Self Care | Payer: Self-pay | Attending: Student | Admitting: Student

## 2020-12-11 DIAGNOSIS — S32401A Unspecified fracture of right acetabulum, initial encounter for closed fracture: Secondary | ICD-10-CM

## 2020-12-11 DIAGNOSIS — Z888 Allergy status to other drugs, medicaments and biological substances status: Secondary | ICD-10-CM

## 2020-12-11 DIAGNOSIS — D62 Acute posthemorrhagic anemia: Secondary | ICD-10-CM | POA: Diagnosis not present

## 2020-12-11 DIAGNOSIS — S32421A Displaced fracture of posterior wall of right acetabulum, initial encounter for closed fracture: Principal | ICD-10-CM | POA: Diagnosis present

## 2020-12-11 DIAGNOSIS — S73014A Posterior dislocation of right hip, initial encounter: Secondary | ICD-10-CM | POA: Diagnosis present

## 2020-12-11 DIAGNOSIS — R52 Pain, unspecified: Secondary | ICD-10-CM

## 2020-12-11 DIAGNOSIS — K219 Gastro-esophageal reflux disease without esophagitis: Secondary | ICD-10-CM | POA: Diagnosis present

## 2020-12-11 DIAGNOSIS — Z8616 Personal history of COVID-19: Secondary | ICD-10-CM

## 2020-12-11 DIAGNOSIS — Z79899 Other long term (current) drug therapy: Secondary | ICD-10-CM

## 2020-12-11 DIAGNOSIS — S73004A Unspecified dislocation of right hip, initial encounter: Secondary | ICD-10-CM

## 2020-12-11 DIAGNOSIS — M79672 Pain in left foot: Secondary | ICD-10-CM | POA: Diagnosis present

## 2020-12-11 DIAGNOSIS — T1490XA Injury, unspecified, initial encounter: Secondary | ICD-10-CM

## 2020-12-11 DIAGNOSIS — Z419 Encounter for procedure for purposes other than remedying health state, unspecified: Secondary | ICD-10-CM

## 2020-12-11 DIAGNOSIS — S32409A Unspecified fracture of unspecified acetabulum, initial encounter for closed fracture: Secondary | ICD-10-CM | POA: Diagnosis present

## 2020-12-11 DIAGNOSIS — Z20822 Contact with and (suspected) exposure to covid-19: Secondary | ICD-10-CM | POA: Diagnosis present

## 2020-12-11 DIAGNOSIS — E669 Obesity, unspecified: Secondary | ICD-10-CM | POA: Diagnosis present

## 2020-12-11 DIAGNOSIS — Z6831 Body mass index (BMI) 31.0-31.9, adult: Secondary | ICD-10-CM

## 2020-12-11 DIAGNOSIS — Y92410 Unspecified street and highway as the place of occurrence of the external cause: Secondary | ICD-10-CM

## 2020-12-12 ENCOUNTER — Inpatient Hospital Stay (HOSPITAL_COMMUNITY): Payer: Self-pay

## 2020-12-12 ENCOUNTER — Emergency Department (HOSPITAL_COMMUNITY): Payer: Self-pay | Admitting: Certified Registered"

## 2020-12-12 ENCOUNTER — Emergency Department (HOSPITAL_COMMUNITY): Payer: Self-pay

## 2020-12-12 ENCOUNTER — Other Ambulatory Visit: Payer: Self-pay

## 2020-12-12 ENCOUNTER — Encounter (HOSPITAL_COMMUNITY): Payer: Self-pay

## 2020-12-12 ENCOUNTER — Encounter (HOSPITAL_COMMUNITY)
Admission: EM | Disposition: A | Discharge status: Home / Self Care | Payer: Self-pay | Source: Home / Self Care | Attending: Student

## 2020-12-12 DIAGNOSIS — S32409A Unspecified fracture of unspecified acetabulum, initial encounter for closed fracture: Secondary | ICD-10-CM | POA: Diagnosis present

## 2020-12-12 HISTORY — PX: ORIF ACETABULAR FRACTURE: SHX5029

## 2020-12-12 LAB — CBC WITH DIFFERENTIAL/PLATELET
Abs Immature Granulocytes: 0.07 10*3/uL (ref 0.00–0.07)
Basophils Absolute: 0.1 10*3/uL (ref 0.0–0.1)
Basophils Relative: 1 %
Eosinophils Absolute: 0.1 10*3/uL (ref 0.0–0.5)
Eosinophils Relative: 1 %
HCT: 42.5 % (ref 39.0–52.0)
Hemoglobin: 14 g/dL (ref 13.0–17.0)
Immature Granulocytes: 1 %
Lymphocytes Relative: 8 %
Lymphs Abs: 1.2 10*3/uL (ref 0.7–4.0)
MCH: 27.3 pg (ref 26.0–34.0)
MCHC: 32.9 g/dL (ref 30.0–36.0)
MCV: 83 fL (ref 80.0–100.0)
Monocytes Absolute: 0.9 10*3/uL (ref 0.1–1.0)
Monocytes Relative: 7 %
Neutro Abs: 11.6 10*3/uL — ABNORMAL HIGH (ref 1.7–7.7)
Neutrophils Relative %: 82 %
Platelets: 184 10*3/uL (ref 150–400)
RBC: 5.12 MIL/uL (ref 4.22–5.81)
RDW: 13.2 % (ref 11.5–15.5)
WBC: 13.9 10*3/uL — ABNORMAL HIGH (ref 4.0–10.5)
nRBC: 0 % (ref 0.0–0.2)

## 2020-12-12 LAB — I-STAT CHEM 8, ED
BUN: 23 mg/dL — ABNORMAL HIGH (ref 6–20)
Calcium, Ion: 1.16 mmol/L (ref 1.15–1.40)
Chloride: 103 mmol/L (ref 98–111)
Creatinine, Ser: 1.2 mg/dL (ref 0.61–1.24)
Glucose, Bld: 180 mg/dL — ABNORMAL HIGH (ref 70–99)
HCT: 41 % (ref 39.0–52.0)
Hemoglobin: 13.9 g/dL (ref 13.0–17.0)
Potassium: 3.5 mmol/L (ref 3.5–5.1)
Sodium: 140 mmol/L (ref 135–145)
TCO2: 27 mmol/L (ref 22–32)

## 2020-12-12 LAB — VITAMIN D 25 HYDROXY (VIT D DEFICIENCY, FRACTURES): Vit D, 25-Hydroxy: 34.48 ng/mL (ref 30–100)

## 2020-12-12 LAB — COMPREHENSIVE METABOLIC PANEL
ALT: 44 U/L (ref 0–44)
AST: 52 U/L — ABNORMAL HIGH (ref 15–41)
Albumin: 3.8 g/dL (ref 3.5–5.0)
Alkaline Phosphatase: 77 U/L (ref 38–126)
Anion gap: 9 (ref 5–15)
BUN: 19 mg/dL (ref 6–20)
CO2: 26 mmol/L (ref 22–32)
Calcium: 9.2 mg/dL (ref 8.9–10.3)
Chloride: 102 mmol/L (ref 98–111)
Creatinine, Ser: 1.3 mg/dL — ABNORMAL HIGH (ref 0.61–1.24)
GFR, Estimated: >60 mL/min (ref >60)
Glucose, Bld: 186 mg/dL — ABNORMAL HIGH (ref 70–99)
Potassium: 3.5 mmol/L (ref 3.5–5.1)
Sodium: 137 mmol/L (ref 135–145)
Total Bilirubin: 1.1 mg/dL (ref 0.3–1.2)
Total Protein: 6.5 g/dL (ref 6.5–8.1)

## 2020-12-12 LAB — RESP PANEL BY RT-PCR (FLU A&B, COVID) ARPGX2
Influenza A by PCR: NEGATIVE
Influenza B by PCR: NEGATIVE
SARS Coronavirus 2 by RT PCR: NEGATIVE

## 2020-12-12 LAB — SAMPLE TO BLOOD BANK

## 2020-12-12 SURGERY — OPEN REDUCTION INTERNAL FIXATION (ORIF) ACETABULAR FRACTURE
Anesthesia: General | Site: Hip | Laterality: Right

## 2020-12-12 MED ORDER — ONDANSETRON HCL 4 MG/2ML IJ SOLN: 4.0000 mg | Freq: Four times a day (QID) | INTRAMUSCULAR | Status: DC | PRN

## 2020-12-12 MED ORDER — MIDAZOLAM HCL 2 MG/2ML IJ SOLN
INTRAMUSCULAR | Status: AC
  Filled 2020-12-12: qty 2

## 2020-12-12 MED ORDER — ONDANSETRON HCL 4 MG/2ML IJ SOLN
4.0000 mg | Freq: Once | INTRAMUSCULAR | Status: AC
  Administered 2020-12-12: 4 mg via INTRAVENOUS
  Filled 2020-12-12: qty 2

## 2020-12-12 MED ORDER — PROPOFOL 10 MG/ML IV BOLUS
INTRAVENOUS | Status: AC | PRN
  Administered 2020-12-12: 48.3 mg via INTRAVENOUS

## 2020-12-12 MED ORDER — POVIDONE-IODINE 10 % EX SWAB: 2.0000 "application " | Freq: Once | CUTANEOUS | Status: DC

## 2020-12-12 MED ORDER — LIDOCAINE HCL (CARDIAC) PF 100 MG/5ML IV SOSY
PREFILLED_SYRINGE | INTRAVENOUS | Status: DC | PRN
  Administered 2020-12-12: 100 mg via INTRATRACHEAL

## 2020-12-12 MED ORDER — 0.9 % SODIUM CHLORIDE (POUR BTL) OPTIME
TOPICAL | Status: DC | PRN
  Administered 2020-12-12: 1000 mL

## 2020-12-12 MED ORDER — METHOCARBAMOL 500 MG PO TABS
500.0000 mg | ORAL_TABLET | Freq: Four times a day (QID) | ORAL | Status: DC | PRN
  Administered 2020-12-12 – 2020-12-16 (×11): 500 mg via ORAL
  Filled 2020-12-12 (×11): qty 1

## 2020-12-12 MED ORDER — METOCLOPRAMIDE HCL 5 MG/ML IJ SOLN: 5.0000 mg | Freq: Three times a day (TID) | INTRAMUSCULAR | Status: DC | PRN

## 2020-12-12 MED ORDER — PHENYLEPHRINE HCL-NACL 20-0.9 MG/250ML-% IV SOLN
INTRAVENOUS | Status: DC | PRN
Start: 2020-12-12 — End: 2020-12-12
  Administered 2020-12-12: 100 ug/min via INTRAVENOUS

## 2020-12-12 MED ORDER — HYDROMORPHONE HCL 1 MG/ML IJ SOLN
0.5000 mg | INTRAMUSCULAR | Status: DC | PRN
  Administered 2020-12-12 (×2): 0.5 mg via INTRAVENOUS
  Filled 2020-12-12 (×2): qty 1

## 2020-12-12 MED ORDER — CEFAZOLIN SODIUM-DEXTROSE 2-4 GM/100ML-% IV SOLN
INTRAVENOUS | Status: AC
  Filled 2020-12-12: qty 100

## 2020-12-12 MED ORDER — PROPOFOL 10 MG/ML IV BOLUS
INTRAVENOUS | Status: AC
  Filled 2020-12-12: qty 20

## 2020-12-12 MED ORDER — FENTANYL CITRATE (PF) 250 MCG/5ML IJ SOLN
INTRAMUSCULAR | Status: DC | PRN
  Administered 2020-12-12 (×2): 50 ug via INTRAVENOUS

## 2020-12-12 MED ORDER — FENTANYL CITRATE PF 50 MCG/ML IJ SOSY
50.0000 ug | PREFILLED_SYRINGE | Freq: Once | INTRAMUSCULAR | Status: AC
  Administered 2020-12-12: 50 ug via INTRAVENOUS
  Filled 2020-12-12: qty 1

## 2020-12-12 MED ORDER — PROPOFOL 10 MG/ML IV BOLUS
0.5000 mg/kg | Freq: Once | INTRAVENOUS | Status: DC
  Filled 2020-12-12: qty 20

## 2020-12-12 MED ORDER — TRANEXAMIC ACID 1000 MG/10ML IV SOLN: INTRAVENOUS | Status: DC | PRN

## 2020-12-12 MED ORDER — KETOROLAC TROMETHAMINE 30 MG/ML IJ SOLN: 30.0000 mg | Freq: Once | INTRAMUSCULAR | Status: DC | PRN

## 2020-12-12 MED ORDER — FENTANYL CITRATE (PF) 250 MCG/5ML IJ SOLN
INTRAMUSCULAR | Status: AC
  Filled 2020-12-12: qty 5

## 2020-12-12 MED ORDER — SUGAMMADEX SODIUM 200 MG/2ML IV SOLN
INTRAVENOUS | Status: DC | PRN
  Administered 2020-12-12: 200 mg via INTRAVENOUS

## 2020-12-12 MED ORDER — CHLORHEXIDINE GLUCONATE 4 % EX LIQD: 60.0000 mL | Freq: Once | CUTANEOUS | Status: DC

## 2020-12-12 MED ORDER — LACTATED RINGERS IV SOLN: INTRAVENOUS | Status: DC

## 2020-12-12 MED ORDER — VANCOMYCIN HCL 1000 MG IV SOLR
INTRAVENOUS | Status: AC
  Filled 2020-12-12: qty 20

## 2020-12-12 MED ORDER — DOCUSATE SODIUM 100 MG PO CAPS
100.0000 mg | ORAL_CAPSULE | Freq: Two times a day (BID) | ORAL | Status: DC
  Administered 2020-12-12 – 2020-12-16 (×7): 100 mg via ORAL
  Filled 2020-12-12 (×8): qty 1

## 2020-12-12 MED ORDER — VANCOMYCIN HCL 1 G IV SOLR
INTRAVENOUS | Status: DC | PRN
  Administered 2020-12-12: 1000 mg via TOPICAL

## 2020-12-12 MED ORDER — LACTATED RINGERS IV SOLN: INTRAVENOUS | Status: DC | PRN

## 2020-12-12 MED ORDER — PROMETHAZINE HCL 25 MG/ML IJ SOLN: 6.2500 mg | INTRAMUSCULAR | Status: DC | PRN

## 2020-12-12 MED ORDER — METOCLOPRAMIDE HCL 5 MG PO TABS: 5.0000 mg | ORAL_TABLET | Freq: Three times a day (TID) | ORAL | Status: DC | PRN

## 2020-12-12 MED ORDER — IOHEXOL 300 MG/ML  SOLN
100.0000 mL | Freq: Once | INTRAMUSCULAR | Status: AC | PRN
  Administered 2020-12-12: 100 mL via INTRAVENOUS

## 2020-12-12 MED ORDER — GLYCOPYRROLATE 0.2 MG/ML IJ SOLN
INTRAMUSCULAR | Status: DC | PRN
  Administered 2020-12-12 (×2): .2 mg via INTRAVENOUS

## 2020-12-12 MED ORDER — ROCURONIUM 10MG/ML (10ML) SYRINGE FOR MEDFUSION PUMP - OPTIME
INTRAVENOUS | Status: DC | PRN
  Administered 2020-12-12: 50 mg via INTRAVENOUS

## 2020-12-12 MED ORDER — DEXAMETHASONE SODIUM PHOSPHATE 10 MG/ML IJ SOLN
INTRAMUSCULAR | Status: AC
  Filled 2020-12-12: qty 1

## 2020-12-12 MED ORDER — PANTOPRAZOLE SODIUM 40 MG PO TBEC
40.0000 mg | DELAYED_RELEASE_TABLET | Freq: Every day | ORAL | Status: DC
  Administered 2020-12-13 – 2020-12-16 (×4): 40 mg via ORAL
  Filled 2020-12-12 (×4): qty 1

## 2020-12-12 MED ORDER — CEFAZOLIN SODIUM-DEXTROSE 2-4 GM/100ML-% IV SOLN
2.0000 g | Freq: Three times a day (TID) | INTRAVENOUS | Status: AC
  Administered 2020-12-12 – 2020-12-13 (×3): 2 g via INTRAVENOUS
  Filled 2020-12-12 (×5): qty 100

## 2020-12-12 MED ORDER — TRANEXAMIC ACID-NACL 1000-0.7 MG/100ML-% IV SOLN
1000.0000 mg | Freq: Once | INTRAVENOUS | Status: AC
  Administered 2020-12-12: 1000 mg via INTRAVENOUS
  Filled 2020-12-12: qty 100

## 2020-12-12 MED ORDER — SODIUM CHLORIDE 0.9 % IV BOLUS
1000.0000 mL | Freq: Once | INTRAVENOUS | Status: AC
  Administered 2020-12-12: 1000 mL via INTRAVENOUS

## 2020-12-12 MED ORDER — HYDROMORPHONE HCL 1 MG/ML IJ SOLN
1.0000 mg | Freq: Once | INTRAMUSCULAR | Status: AC
  Administered 2020-12-12: 1 mg via INTRAVENOUS
  Filled 2020-12-12: qty 1

## 2020-12-12 MED ORDER — DEXAMETHASONE SODIUM PHOSPHATE 10 MG/ML IJ SOLN
INTRAMUSCULAR | Status: DC | PRN
  Administered 2020-12-12: 10 mg via INTRAVENOUS

## 2020-12-12 MED ORDER — ONDANSETRON HCL 4 MG/2ML IJ SOLN
INTRAMUSCULAR | Status: DC | PRN
  Administered 2020-12-12: 4 mg via INTRAVENOUS

## 2020-12-12 MED ORDER — HYDROMORPHONE HCL 1 MG/ML IJ SOLN
0.5000 mg | INTRAMUSCULAR | Status: DC | PRN
  Administered 2020-12-12: 1 mg via INTRAVENOUS
  Filled 2020-12-12: qty 1

## 2020-12-12 MED ORDER — METHOCARBAMOL 1000 MG/10ML IJ SOLN
500.0000 mg | Freq: Four times a day (QID) | INTRAVENOUS | Status: DC | PRN
  Filled 2020-12-12: qty 5

## 2020-12-12 MED ORDER — POLYETHYLENE GLYCOL 3350 17 G PO PACK
17.0000 g | PACK | Freq: Every day | ORAL | Status: DC | PRN
  Administered 2020-12-14: 17 g via ORAL
  Filled 2020-12-12: qty 1

## 2020-12-12 MED ORDER — TRANEXAMIC ACID-NACL 1000-0.7 MG/100ML-% IV SOLN
INTRAVENOUS | Status: AC
  Filled 2020-12-12: qty 100

## 2020-12-12 MED ORDER — ACETAMINOPHEN 500 MG PO TABS
1000.0000 mg | ORAL_TABLET | Freq: Three times a day (TID) | ORAL | Status: DC
  Administered 2020-12-12 – 2020-12-16 (×12): 1000 mg via ORAL
  Filled 2020-12-12 (×12): qty 2

## 2020-12-12 MED ORDER — PROPOFOL 10 MG/ML IV BOLUS
INTRAVENOUS | Status: DC | PRN
  Administered 2020-12-12: 200 mg via INTRAVENOUS

## 2020-12-12 MED ORDER — TRANEXAMIC ACID-NACL 1000-0.7 MG/100ML-% IV SOLN
INTRAVENOUS | Status: DC | PRN
  Administered 2020-12-12: 1000 mg via INTRAVENOUS

## 2020-12-12 MED ORDER — CEFAZOLIN SODIUM-DEXTROSE 2-4 GM/100ML-% IV SOLN: 2.0000 g | INTRAVENOUS | Status: DC

## 2020-12-12 MED ORDER — POTASSIUM CHLORIDE IN NACL 20-0.9 MEQ/L-% IV SOLN
INTRAVENOUS | Status: DC
  Filled 2020-12-12 (×2): qty 1000

## 2020-12-12 MED ORDER — ENOXAPARIN SODIUM 40 MG/0.4ML IJ SOSY
40.0000 mg | PREFILLED_SYRINGE | INTRAMUSCULAR | Status: DC
  Administered 2020-12-13 – 2020-12-16 (×4): 40 mg via SUBCUTANEOUS
  Filled 2020-12-12 (×4): qty 0.4

## 2020-12-12 MED ORDER — OXYCODONE HCL 5 MG PO TABS
10.0000 mg | ORAL_TABLET | ORAL | Status: DC | PRN
  Administered 2020-12-13 (×2): 10 mg via ORAL
  Administered 2020-12-14: 15 mg via ORAL
  Administered 2020-12-14: 10 mg via ORAL
  Filled 2020-12-12: qty 2
  Filled 2020-12-12: qty 3

## 2020-12-12 MED ORDER — ALBUMIN HUMAN 5 % IV SOLN: INTRAVENOUS | Status: DC | PRN

## 2020-12-12 MED ORDER — ONDANSETRON HCL 4 MG PO TABS: 4.0000 mg | ORAL_TABLET | Freq: Four times a day (QID) | ORAL | Status: DC | PRN

## 2020-12-12 MED ORDER — PHENYLEPHRINE HCL (PRESSORS) 10 MG/ML IV SOLN
INTRAVENOUS | Status: DC | PRN
  Administered 2020-12-12: 80 ug via INTRAVENOUS
  Administered 2020-12-12 (×2): 120 ug via INTRAVENOUS
  Administered 2020-12-12: 80 ug via INTRAVENOUS
  Administered 2020-12-12: 40 ug via INTRAVENOUS

## 2020-12-12 MED ORDER — OXYCODONE HCL 5 MG PO TABS
5.0000 mg | ORAL_TABLET | ORAL | Status: DC | PRN
  Administered 2020-12-12 – 2020-12-13 (×3): 10 mg via ORAL
  Filled 2020-12-12 (×5): qty 2

## 2020-12-12 MED ORDER — HYDROMORPHONE HCL 1 MG/ML IJ SOLN: 0.2500 mg | INTRAMUSCULAR | Status: DC | PRN

## 2020-12-12 MED ORDER — SUCCINYLCHOLINE 20MG/ML (10ML) SYRINGE FOR MEDFUSION PUMP - OPTIME
INTRAMUSCULAR | Status: DC | PRN
  Administered 2020-12-12: 140 mg via INTRAVENOUS

## 2020-12-12 MED ORDER — MEPERIDINE HCL 25 MG/ML IJ SOLN: 6.2500 mg | INTRAMUSCULAR | Status: DC | PRN

## 2020-12-12 SURGICAL SUPPLY — 60 items
ADH SKN CLS APL DERMABOND .7 (GAUZE/BANDAGES/DRESSINGS) ×2
APL PRP STRL LF DISP 70% ISPRP (MISCELLANEOUS) ×2
BAG COUNTER SPONGE SURGICOUNT (BAG) ×3 IMPLANT
BAG SPNG CNTER NS LX DISP (BAG) ×1
BIT DRILL 2.5X300 (BIT) IMPLANT
BRUSH SCRUB EZ PLAIN DRY (MISCELLANEOUS) ×5 IMPLANT
CHLORAPREP W/TINT 26 (MISCELLANEOUS) ×4 IMPLANT
COVER SURGICAL LIGHT HANDLE (MISCELLANEOUS) ×4 IMPLANT
DERMABOND ADVANCED (GAUZE/BANDAGES/DRESSINGS) ×2
DERMABOND ADVANCED .7 DNX12 (GAUZE/BANDAGES/DRESSINGS) IMPLANT
DRAPE C-ARM 42X72 X-RAY (DRAPES) ×3 IMPLANT
DRAPE C-ARMOR (DRAPES) ×3 IMPLANT
DRAPE INCISE IOBAN 66X45 STRL (DRAPES) ×3 IMPLANT
DRAPE INCISE IOBAN 85X60 (DRAPES) ×3 IMPLANT
DRAPE ORTHO SPLIT 77X108 STRL (DRAPES) ×4
DRAPE SURG ORHT 6 SPLT 77X108 (DRAPES) ×4 IMPLANT
DRAPE U-SHAPE 47X51 STRL (DRAPES) ×3 IMPLANT
DRILL BIT 2.5X300 (BIT) ×4
DRSG AQUACEL AG ADV 3.5X14 (GAUZE/BANDAGES/DRESSINGS) ×1 IMPLANT
ELECT BLADE 6.5 EXT (BLADE) ×3 IMPLANT
ELECT REM PT RETURN 9FT ADLT (ELECTROSURGICAL) ×2
ELECTRODE REM PT RTRN 9FT ADLT (ELECTROSURGICAL) ×2 IMPLANT
GLOVE SURG ENC MOIS LTX SZ6.5 (GLOVE) ×9 IMPLANT
GLOVE SURG ENC MOIS LTX SZ7.5 (GLOVE) ×11 IMPLANT
GLOVE SURG UNDER POLY LF SZ6.5 (GLOVE) ×4 IMPLANT
GLOVE SURG UNDER POLY LF SZ7.5 (GLOVE) ×4 IMPLANT
GOWN STRL REUS W/ TWL LRG LVL3 (GOWN DISPOSABLE) ×4 IMPLANT
GOWN STRL REUS W/TWL LRG LVL3 (GOWN DISPOSABLE) ×4
HANDPIECE INTERPULSE COAX TIP (DISPOSABLE) ×2
KIT BASIN OR (CUSTOM PROCEDURE TRAY) ×3 IMPLANT
KIT TURNOVER KIT B (KITS) ×3 IMPLANT
MANIFOLD NEPTUNE II (INSTRUMENTS) ×3 IMPLANT
NS IRRIG 1000ML POUR BTL (IV SOLUTION) ×3 IMPLANT
PACK TOTAL JOINT (CUSTOM PROCEDURE TRAY) ×3 IMPLANT
PAD ARMBOARD 7.5X6 YLW CONV (MISCELLANEOUS) ×6 IMPLANT
PLATE CRVD RECON LP 3.5X108 8H (Plate) ×1 IMPLANT
RETRIEVER SUT HEWSON (MISCELLANEOUS) ×3 IMPLANT
SCREW CORTEX 3.5 30MM (Screw) ×1 IMPLANT
SCREW CORTEX 3.5 34MM (Screw) ×1 IMPLANT
SCREW CORTEX 3.5 36MM (Screw) ×1 IMPLANT
SCREW CORTEX 3.5 65MM (Screw) ×1 IMPLANT
SCREW LOCK CORT ST 3.5X30 (Screw) IMPLANT
SCREW LOCK CORT ST 3.5X34 (Screw) IMPLANT
SCREW LOCK CORT ST 3.5X36 (Screw) IMPLANT
SET HNDPC FAN SPRY TIP SCT (DISPOSABLE) ×2 IMPLANT
SPONGE T-LAP 18X18 ~~LOC~~+RFID (SPONGE) ×3 IMPLANT
SUCTION FRAZIER HANDLE 10FR (MISCELLANEOUS) ×2
SUCTION TUBE FRAZIER 10FR DISP (MISCELLANEOUS) ×2 IMPLANT
SUT ETHILON 2 0 PSLX (SUTURE) ×6 IMPLANT
SUT FIBERWIRE #2 38 T-5 BLUE (SUTURE) ×4
SUT MNCRL AB 3-0 PS2 18 (SUTURE) ×1 IMPLANT
SUT VIC AB 0 CT1 27 (SUTURE) ×4
SUT VIC AB 0 CT1 27XBRD ANBCTR (SUTURE) ×2 IMPLANT
SUT VIC AB 1 CT1 27 (SUTURE) ×4
SUT VIC AB 1 CT1 27XBRD ANBCTR (SUTURE) ×2 IMPLANT
SUT VIC AB 2-0 CT1 27 (SUTURE) ×4
SUT VIC AB 2-0 CT1 TAPERPNT 27 (SUTURE) ×2 IMPLANT
SUTURE FIBERWR #2 38 T-5 BLUE (SUTURE) ×4 IMPLANT
TOWEL GREEN STERILE (TOWEL DISPOSABLE) ×6 IMPLANT
TOWEL GREEN STERILE FF (TOWEL DISPOSABLE) ×6 IMPLANT
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
## 2020-12-12 NOTE — Anesthesia Procedure Notes (Addendum)
Procedure Name: Intubation Date/Time: 12/12/2020 7:48 AM Performed by: Claris Che, CRNA Pre-anesthesia Checklist: Patient identified, Emergency Drugs available, Suction available, Patient being monitored and Timeout performed Patient Re-evaluated:Patient Re-evaluated prior to induction Oxygen Delivery Method: Circle system utilized Preoxygenation: Pre-oxygenation with 100% oxygen Induction Type: IV induction and Cricoid Pressure applied Ventilation: Mask ventilation without difficulty Laryngoscope Size: Mac and 4 Grade View: Grade II Tube type: Oral Tube size: 8.0 mm Number of attempts: 1 Airway Equipment and Method: Stylet Placement Confirmation: ETT inserted through vocal cords under direct vision, positive ETCO2 and breath sounds checked- equal and bilateral Secured at: 24 cm Tube secured with: Tape Dental Injury: Teeth and Oropharynx as per pre-operative assessment

## 2020-12-12 NOTE — ED Notes (Signed)
Pt's fiance confirmed that she is the pt's ride home from hospital.
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
## 2020-12-12 NOTE — ED Provider Notes (Signed)
.  Sedation  Date/Time: 12/12/2020 3:13 AM Performed by: Maudie Flakes, MD Authorized by: Maudie Flakes, MD   Consent:    Consent obtained:  Verbal and written   Consent given by:  Patient and spouse   Risks discussed:  Allergic reaction, dysrhythmia, inadequate sedation, nausea, respiratory compromise necessitating ventilatory assistance and intubation, prolonged hypoxia resulting in organ damage and vomiting Universal protocol:    Immediately prior to procedure, a time out was called: yes     Patient identity confirmed:  Arm band and verbally with patient Indications:    Procedure performed:  Dislocation reduction   Procedure necessitating sedation performed by:  Physician performing sedation Pre-sedation assessment:    Time since last food or drink:  4 hours   ASA classification: class 1 - normal, healthy patient     Mouth opening:  3 or more finger widths   Mallampati score:  I - soft palate, uvula, fauces, pillars visible   Neck mobility: normal     Pre-sedation assessments completed and reviewed: airway patency, cardiovascular function, hydration status, mental status, nausea/vomiting, pain level, respiratory function and temperature   Immediate pre-procedure details:    Reviewed: vital signs, relevant labs/tests and NPO status     Verified: bag valve mask available, emergency equipment available, intubation equipment available, IV patency confirmed, oxygen available and suction available   Procedure details (see MAR for exact dosages):    Preoxygenation:  Nasal cannula   Sedation:  Propofol   Intended level of sedation: deep   Analgesia:  Hydromorphone   Intra-procedure monitoring:  Blood pressure monitoring, cardiac monitor, continuous pulse oximetry, continuous capnometry, frequent LOC assessments and frequent vital sign checks   Intra-procedure events: none     Total Provider sedation time (minutes):  18 Post-procedure details:    Attendance: Constant attendance by  certified staff until patient recovered     Recovery: Patient returned to pre-procedure baseline     Post-sedation assessments completed and reviewed: airway patency, cardiovascular function, hydration status, mental status, nausea/vomiting, pain level, respiratory function and temperature     Patient is stable for discharge or admission: yes     Procedure completion:  Tolerated well, no immediate complications Reduction of dislocation  Date/Time: 12/12/2020 3:14 AM Performed by: Maudie Flakes, MD Authorized by: Maudie Flakes, MD  Consent: Verbal consent obtained. Written consent obtained. Risks and benefits: risks, benefits and alternatives were discussed Consent given by: patient and spouse Patient understanding: patient states understanding of the procedure being performed Patient consent: the patient's understanding of the procedure matches consent given Procedure consent: procedure consent matches procedure scheduled Relevant documents: relevant documents present and verified Test results: test results available and properly labeled Imaging studies: imaging studies available Required items: required blood products, implants, devices, and special equipment available Patient identity confirmed: verbally with patient and arm band Time out: Immediately prior to procedure a "time out" was called to verify the correct patient, procedure, equipment, support staff and site/side marked as required. Local anesthesia used: no  Anesthesia: Local anesthesia used: no  Sedation: Patient sedated: yes  Patient tolerance: patient tolerated the procedure well with no immediate complications Comments: Reduction using mild lateral and inferior traction.  Traction then applied to leg by ortho tech.      Maudie Flakes, MD 12/12/20 [phone]
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
## 2020-12-12 NOTE — ED Notes (Signed)
Pt gave permission for his fiance (at bedside) to sign the consent form for planned surgery. Consent signed by fiance & witnessed by this RN.

## 2020-12-12 NOTE — Anesthesia Preprocedure Evaluation (Addendum)
Anesthesia Evaluation  Patient identified by MRN, date of birth, ID band Patient awake    Airway Mallampati: II       Dental no notable dental hx.    Pulmonary    Pulmonary exam normal        Cardiovascular negative cardio ROS Normal cardiovascular exam     Neuro/Psych negative neurological ROS  negative psych ROS   GI/Hepatic Neg liver ROS, GERD  Medicated,  Endo/Other  negative endocrine ROS  Renal/GU negative Renal ROS  negative genitourinary   Musculoskeletal   Abdominal (+) + obese,   Peds  Hematology negative hematology ROS (+)   Anesthesia Other Findings   Reproductive/Obstetrics                            Anesthesia Physical Anesthesia Plan  ASA: 2  Anesthesia Plan: General   Post-op Pain Management:    Induction: Intravenous  PONV Risk Score and Plan: 3 and Ondansetron, Dexamethasone and Midazolam  Airway Management Planned: Oral ETT  Additional Equipment: None  Intra-op Plan:   Post-operative Plan: Extubation in OR  Informed Consent: I have reviewed the patients History and Physical, chart, labs and discussed the procedure including the risks, benefits and alternatives for the proposed anesthesia with the patient or authorized representative who has indicated his/her understanding and acceptance.     Dental advisory given  Plan Discussed with: CRNA  Anesthesia Plan Comments:         Anesthesia Quick Evaluation

## 2020-12-12 NOTE — ED Notes (Signed)
Patient transported to CT 

## 2020-12-12 NOTE — ED Notes (Signed)
Pt returned CT scan.
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
## 2020-12-12 NOTE — Progress Notes (Signed)
Orthopedic Tech Progress Note Patient Details:  Benjamin Robinson May 29, 1970 [id number]  Musculoskeletal Traction Type of Traction: Bucks Skin Traction Traction Location: rle Traction Weight: 15 lbs  Applied post reduction Post Interventions Patient Tolerated: Well Instructions Provided: Care of device, Adjustment of device  [name] 12/12/2020, 3:15 AM

## 2020-12-12 NOTE — Transfer of Care (Signed)
Immediate Anesthesia Transfer of Care Note  Patient: Benjamin Robinson  Procedure(s) Performed: OPEN REDUCTION INTERNAL FIXATION (ORIF) ACETABULAR FRACTURE (Right: Hip)  Patient Location: PACU  Anesthesia Type:General  Level of Consciousness: drowsy, patient cooperative and responds to stimulation  Airway & Oxygen Therapy: Patient Spontanous Breathing and Patient connected to face mask oxygen  Post-op Assessment: Report given to RN, Post -op Vital signs reviewed and stable and Patient moving all extremities X 4  Post vital signs: Reviewed and stable  Last Vitals:  Vitals Value Taken Time  BP 164/91 12/12/20 1026  Temp    Pulse 120 12/12/20 1026  Resp 24 12/12/20 1028  SpO2 95 % 12/12/20 1026  Vitals shown include unvalidated device data.  Last Pain:  Vitals:   12/12/20 0620  TempSrc: Oral  PainSc:          Complications: No notable events documented.

## 2020-12-13 ENCOUNTER — Inpatient Hospital Stay (HOSPITAL_COMMUNITY): Payer: Self-pay

## 2020-12-13 ENCOUNTER — Encounter (HOSPITAL_COMMUNITY): Payer: Self-pay | Admitting: Student

## 2020-12-13 LAB — BASIC METABOLIC PANEL
Anion gap: 8 (ref 5–15)
BUN: 14 mg/dL (ref 6–20)
CO2: 24 mmol/L (ref 22–32)
Calcium: 8.8 mg/dL — ABNORMAL LOW (ref 8.9–10.3)
Chloride: 102 mmol/L (ref 98–111)
Creatinine, Ser: 1.27 mg/dL — ABNORMAL HIGH (ref 0.61–1.24)
GFR, Estimated: >60 mL/min (ref >60)
Glucose, Bld: 143 mg/dL — ABNORMAL HIGH (ref 70–99)
Potassium: 4.2 mmol/L (ref 3.5–5.1)
Sodium: 134 mmol/L — ABNORMAL LOW (ref 135–145)

## 2020-12-13 LAB — CBC
HCT: 38.8 % — ABNORMAL LOW (ref 39.0–52.0)
Hemoglobin: 12.7 g/dL — ABNORMAL LOW (ref 13.0–17.0)
MCH: 27.1 pg (ref 26.0–34.0)
MCHC: 32.7 g/dL (ref 30.0–36.0)
MCV: 82.9 fL (ref 80.0–100.0)
Platelets: 156 10*3/uL (ref 150–400)
RBC: 4.68 MIL/uL (ref 4.22–5.81)
RDW: 13.2 % (ref 11.5–15.5)
WBC: 12.6 10*3/uL — ABNORMAL HIGH (ref 4.0–10.5)
nRBC: 0 % (ref 0.0–0.2)

## 2020-12-13 MED ORDER — KETOROLAC TROMETHAMINE 15 MG/ML IJ SOLN: 15.0000 mg | Freq: Four times a day (QID) | INTRAMUSCULAR | Status: DC

## 2020-12-13 MED ORDER — MENTHOL 3 MG MT LOZG: 1.0000 | LOZENGE | OROMUCOSAL | Status: DC | PRN

## 2020-12-13 MED ORDER — KETOROLAC TROMETHAMINE 15 MG/ML IJ SOLN
15.0000 mg | Freq: Four times a day (QID) | INTRAMUSCULAR | Status: AC
  Administered 2020-12-13 – 2020-12-14 (×5): 15 mg via INTRAVENOUS
  Filled 2020-12-13 (×5): qty 1

## 2020-12-13 MED ORDER — HYDRALAZINE HCL 10 MG PO TABS: 10.0000 mg | ORAL_TABLET | Freq: Four times a day (QID) | ORAL | Status: DC | PRN
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
## 2020-12-13 NOTE — Plan of Care (Signed)
  Problem: Clinical Measurements: Goal: Will remain free from infection Outcome: Progressing   Problem: Activity: Goal: Risk for activity intolerance will decrease Outcome: Progressing   
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
## 2020-12-13 NOTE — Plan of Care (Signed)
  Problem: Clinical Measurements: Goal: Will remain free from infection Outcome: Progressing   Problem: Activity: Goal: Risk for activity intolerance will decrease Outcome: Progressing   Problem: Pain Managment: Goal: General experience of comfort will improve Outcome: Progressing   Problem: Safety: Goal: Ability to remain free from injury will improve Outcome: Progressing
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
## 2020-12-13 NOTE — Anesthesia Postprocedure Evaluation (Signed)
Anesthesia Post Note  Patient: Benjamin Robinson  Procedure(s) Performed: OPEN REDUCTION INTERNAL FIXATION (ORIF) ACETABULAR FRACTURE (Right: Hip)     Patient location during evaluation: PACU Anesthesia Type: General Level of consciousness: sedated Pain management: pain level controlled Vital Signs Assessment: post-procedure vital signs reviewed and stable Respiratory status: spontaneous breathing Cardiovascular status: stable Postop Assessment: no apparent nausea or vomiting Anesthetic complications: no   No notable events documented.  Last Vitals:  Vitals:   12/12/20 1450 12/12/20 2215  BP: (!) 143/75 133/69  Pulse: 64 73  Resp: 17 16  Temp: 37.6 C 37.3 C  SpO2: 92% 96%    Last Pain:  Vitals:   12/13/20 0242  TempSrc:   PainSc: [place] Jr

## 2020-12-14 LAB — CBC
HCT: 35.8 % — ABNORMAL LOW (ref 39.0–52.0)
Hemoglobin: 12 g/dL — ABNORMAL LOW (ref 13.0–17.0)
MCH: 27.5 pg (ref 26.0–34.0)
MCHC: 33.5 g/dL (ref 30.0–36.0)
MCV: 81.9 fL (ref 80.0–100.0)
Platelets: 149 10*3/uL — ABNORMAL LOW (ref 150–400)
RBC: 4.37 MIL/uL (ref 4.22–5.81)
RDW: 13.1 % (ref 11.5–15.5)
WBC: 7.2 10*3/uL (ref 4.0–10.5)
nRBC: 0 % (ref 0.0–0.2)

## 2020-12-14 LAB — BASIC METABOLIC PANEL
Anion gap: 7 (ref 5–15)
BUN: 14 mg/dL (ref 6–20)
CO2: 28 mmol/L (ref 22–32)
Calcium: 8.6 mg/dL — ABNORMAL LOW (ref 8.9–10.3)
Chloride: 101 mmol/L (ref 98–111)
Creatinine, Ser: 1.09 mg/dL (ref 0.61–1.24)
GFR, Estimated: >60 mL/min (ref >60)
Glucose, Bld: 119 mg/dL — ABNORMAL HIGH (ref 70–99)
Potassium: 3.8 mmol/L (ref 3.5–5.1)
Sodium: 136 mmol/L (ref 135–145)
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
## 2020-12-14 NOTE — Plan of Care (Signed)
  Problem: Education: Goal: Knowledge of General Education information will improve Description: Including pain rating scale, medication(s)/side effects and non-pharmacologic comfort measures Outcome: Progressing   Problem: Activity: Goal: Risk for activity intolerance will decrease Outcome: Progressing   Problem: Coping: Goal: Level of anxiety will decrease Outcome: Progressing   Problem: Elimination: Goal: Will not experience complications related to bowel motility Outcome: Progressing   Problem: Pain Managment: Goal: General experience of comfort will improve Outcome: Progressing   Problem: Safety: Goal: Ability to remain free from injury will improve Outcome: Progressing
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
## 2020-12-15 LAB — BASIC METABOLIC PANEL
Anion gap: 9 (ref 5–15)
BUN: 14 mg/dL (ref 6–20)
CO2: 27 mmol/L (ref 22–32)
Calcium: 9.3 mg/dL (ref 8.9–10.3)
Chloride: 100 mmol/L (ref 98–111)
Creatinine, Ser: 1.04 mg/dL (ref 0.61–1.24)
GFR, Estimated: >60 mL/min (ref >60)
Glucose, Bld: 101 mg/dL — ABNORMAL HIGH (ref 70–99)
Potassium: 4.2 mmol/L (ref 3.5–5.1)
Sodium: 136 mmol/L (ref 135–145)

## 2020-12-15 NOTE — Progress Notes (Signed)
Orthopaedic Trauma Progress Note  SUBJECTIVE: Still having a lot of pain. Currently working with therapy. Making progress but moving relatively slow  OBJECTIVE:  Vitals:   12/14/20 2110 12/15/20 1021  BP: 132/87 129/84  Pulse: 88 76  Resp: 16 14  Temp: 98.6 F (37 C) 98.8 F (37.1 C)  SpO2: 100% 98%    General: Sitting up in bed, no acute distress. Pleasant and cooperative Respiratory: No increased work of breathing.  Right lower extremity: Dressing CDI.  Tender with palpation around incisional area and throughout thigh.  Ankle dorsiflexion/plantarflexion is intact.  Endorses sensation light touch throughout the extremity.  Compartment soft and compressible.  Neurovascularly intact.  IMAGING: Stable post op imaging.   LABS:  Results for orders placed or performed during the hospital encounter of 12/11/20 (from the past 24 hour(s))  Basic metabolic panel     Status: Abnormal   Collection Time: 12/15/20  4:28 AM  Result Value Ref Range   Sodium 136 135 - 145 mmol/L   Potassium 4.2 3.5 - 5.1 mmol/L   Chloride 100 98 - 111 mmol/L   CO2 27 22 - 32 mmol/L   Glucose, Bld 101 (H) 70 - 99 mg/dL   BUN 14 6 - 20 mg/dL   Creatinine, Ser 1.04 0.61 - 1.24 mg/dL   Calcium 9.3 8.9 - 10.3 mg/dL   GFR, Estimated >60 >60 mL/min   Anion gap 9 5 - 15    ASSESSMENT: Benjamin Robinson is a 50 y.o. male, 3 Days Post-Op s/p ORIF RIGHT ACETABULAR FRACTURE  CV/Blood loss: Acute blood loss anemia, Hgb 12.0 this morning.   PLAN: Weightbearing: TDWB RLE Incisional and dressing care: Dressings left intact until follow-up Showering: Okay to begin showering with assistance.  Aquacel dressing may get wet. Orthopedic device(s): None  Pain management:  1. Tylenol 1000 mg q 8 hours scheduled 2. Robaxin 500 mg q 6 hours PRN 3. Oxycodone 5-15 mg q 4 hours PRN 4. Toradol 15 mg q 6 hours x5 doses 5. Dilaudid 0.5-1 mg q 4 hours PRN VTE prophylaxis: Lovenox, SCDs ID:  Ancef 2gm post op completed Foley/Lines:   No foley, KVO IVFs Impediments to Fracture Healing: Vitamin D level 34, no need for supplementation Dispo: Therapies as tolerated. PT/OT recommending HH. Continue to wokr on pain control. Plan for discharge likely tomorrow  Follow - up plan: 2 weeks after discharge for repeat x-rays  Contact information:  Katha Hamming MD, Patrecia Pace PA-C. After hours and holidays please check [email or web address] for group call information for [facility]

## 2020-12-15 NOTE — Plan of Care (Signed)
  Problem: Education: Goal: Knowledge of General Education information will improve Description: Including pain rating scale, medication(s)/side effects and non-pharmacologic comfort measures Outcome: Progressing   Problem: Health Behavior/Discharge Planning: Goal: Ability to manage health-related needs will improve Outcome: Progressing   Problem: Clinical Measurements: Goal: Ability to maintain clinical measurements within normal limits will improve Outcome: Progressing   Problem: Activity: Goal: Risk for activity intolerance will decrease Outcome: Progressing   Problem: Nutrition: Goal: Adequate nutrition will be maintained Outcome: Progressing   Problem: Elimination: Goal: Will not experience complications related to bowel motility Outcome: Progressing   Problem: Pain Managment: Goal: General experience of comfort will improve Outcome: Progressing   Problem: Safety: Goal: Ability to remain free from injury will improve Outcome: Progressing
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
## 2020-12-16 ENCOUNTER — Other Ambulatory Visit (HOSPITAL_COMMUNITY): Payer: Self-pay

## 2020-12-16 MED ORDER — DOCUSATE SODIUM 100 MG PO CAPS
100.0000 mg | ORAL_CAPSULE | Freq: Two times a day (BID) | ORAL | 0 refills | Status: DC
  Filled 2020-12-16: qty 10, 5d supply, fill #0

## 2020-12-16 MED ORDER — ACETAMINOPHEN 500 MG PO TABS
1000.0000 mg | ORAL_TABLET | Freq: Three times a day (TID) | ORAL | 0 refills | Status: DC
  Filled 2020-12-16: qty 60, 10d supply, fill #0

## 2020-12-16 MED ORDER — OXYCODONE HCL 10 MG PO TABS
10.0000 mg | ORAL_TABLET | ORAL | 0 refills | Status: DC | PRN
  Filled 2020-12-16: qty 42, 7d supply, fill #0

## 2020-12-16 MED ORDER — METHOCARBAMOL 500 MG PO TABS
500.0000 mg | ORAL_TABLET | Freq: Four times a day (QID) | ORAL | 0 refills | Status: DC | PRN
Start: 2020-12-16 — End: 2021-11-07
  Filled 2020-12-16: qty 28, 7d supply, fill #0

## 2020-12-16 MED ORDER — APIXABAN 2.5 MG PO TABS
2.5000 mg | ORAL_TABLET | Freq: Two times a day (BID) | ORAL | 0 refills | Status: DC
  Filled 2020-12-16: qty 60, 30d supply, fill #0
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
## 2020-12-16 NOTE — Discharge Instructions (Addendum)
Orthopaedic Trauma Service Discharge Instructions   General Discharge Instructions  WEIGHT BEARING STATUS:Touchdown weightbearing right lower extremity  RANGE OF MOTION/ACTIVITY:Ok for hip range of motion as tolerated  Wound Care: Leave Aquacel dressing in place over right hip incision until follow-up with surgeon.  Your dressing may get wet when showering. If your dressing comes off before follow-up appointment, please follow wound care instructions below.  DVT/PE prophylaxis:  Eliquis  Diet: as you were eating previously.  Can use over the counter stool softeners and bowel preparations, such as Miralax, to help with bowel movements.  Narcotics can be constipating.  Be sure to drink plenty of fluids  PAIN MEDICATION USE AND EXPECTATIONS  You have likely been given narcotic medications to help control your pain.  After a traumatic event that results in an fracture (broken bone) with or without surgery, it is ok to use narcotic pain medications to help control one's pain.  We understand that everyone responds to pain differently and each individual patient will be evaluated on a regular basis for the continued need for narcotic medications. Ideally, narcotic medication use should last no more than 6-8 weeks (coinciding with fracture healing).   As a patient it is your responsibility as well to monitor narcotic medication use and report the amount and frequency you use these medications when you come to your office visit.   We would also advise that if you are using narcotic medications, you should take a dose prior to therapy to maximize you participation.  IF YOU ARE ON NARCOTIC MEDICATIONS IT IS NOT PERMISSIBLE TO OPERATE A MOTOR VEHICLE (MOTORCYCLE/CAR/TRUCK/MOPED) OR HEAVY MACHINERY DO NOT MIX NARCOTICS WITH OTHER CNS (CENTRAL NERVOUS SYSTEM) DEPRESSANTS SUCH AS ALCOHOL   STOP SMOKING OR USING NICOTINE PRODUCTS!!!!  As discussed nicotine severely impairs your body's ability to heal  surgical and traumatic wounds but also impairs bone healing.  Wounds and bone heal by forming microscopic blood vessels (angiogenesis) and nicotine is a vasoconstrictor (essentially, shrinks blood vessels).  Therefore, if vasoconstriction occurs to these microscopic blood vessels they essentially disappear and are unable to deliver necessary nutrients to the healing tissue.  This is one modifiable factor that you can do to dramatically increase your chances of healing your injury.    (This means no smoking, no nicotine gum, patches, etc)  DO NOT USE NONSTEROIDAL ANTI-INFLAMMATORY DRUGS (NSAID'S)  Using products such as Advil (ibuprofen), Aleve (naproxen), Motrin (ibuprofen) for additional pain control during fracture healing can delay and/or prevent the healing response.  If you would like to take over the counter (OTC) medication, Tylenol (acetaminophen) is ok.  However, some narcotic medications that are given for pain control contain acetaminophen as well. Therefore, you should not exceed more than 4000 mg of tylenol in a day if you do not have liver disease.  Also note that there are may OTC medicines, such as cold medicines and allergy medicines that my contain tylenol as well.  If you have any questions about medications and/or interactions please ask your doctor/PA or your pharmacist.      ICE AND ELEVATE INJURED/OPERATIVE EXTREMITY  Using ice and elevating the injured extremity above your heart can help with swelling and pain control.  Icing in a pulsatile fashion, such as 20 minutes on and 20 minutes off, can be followed.    Do not place ice directly on skin. Make sure there is a barrier between to skin and the ice pack.    Using frozen items such as frozen  peas works well as the conform nicely to the are that needs to be iced.  USE AN ACE WRAP OR TED HOSE FOR SWELLING CONTROL  In addition to icing and elevation, Ace wraps or TED hose are used to help limit and resolve swelling.  It is  recommended to use Ace wraps or TED hose until you are informed to stop.    When using Ace Wraps start the wrapping distally (farthest away from the body) and wrap proximally (closer to the body)   Example: If you had surgery on your leg or thing and you do not have a splint on, start the ace wrap at the toes and work your way up to the thigh        If you had surgery on your upper extremity and do not have a splint on, start the ace wrap at your fingers and work your way up to the upper arm   [place]: [phone]   VISIT OUR WEBSITE FOR ADDITIONAL INFORMATION: [email or web address]    Discharge Wound Care Instructions  Do NOT apply any ointments, solutions or lotions to surgical wounds.  These prevent needed drainage and even though solutions like hydrogen peroxide kill bacteria, they also damage cells lining the pin sites that help fight infection.  Applying lotions or ointments can keep the wounds moist and can cause them to breakdown and open up as well. This can increase the risk for infection. When in doubt call the office.  If any drainage is noted, use a foam dressing which can be found at any drugstore or [employer].  If dressing comes off clean gently with Dial soap and water daily. Do not scrub over incision, just let the soap and water run over the area. Once the incision is completely dry and without drainage, it may be left open to air out.

## 2020-12-16 NOTE — Plan of Care (Signed)

## 2020-12-16 NOTE — Progress Notes (Signed)
Patient was discharged to home, AVS reviewed, DME provided, all questions answered. Left floor with the assistance of nurse tech, and his wife provided transportation

## 2020-12-29 ENCOUNTER — Telehealth (HOSPITAL_COMMUNITY): Payer: Self-pay | Admitting: Pharmacist

## 2020-12-29 NOTE — Telephone Encounter (Signed)
Patient did not answer, LVM

## 2021-01-04 ENCOUNTER — Encounter (HOSPITAL_COMMUNITY): Payer: Self-pay | Admitting: Physical Therapy

## 2021-01-04 ENCOUNTER — Other Ambulatory Visit: Payer: Self-pay

## 2021-01-04 ENCOUNTER — Ambulatory Visit (HOSPITAL_COMMUNITY): Payer: Self-pay | Attending: Student | Admitting: Physical Therapy

## 2021-01-04 DIAGNOSIS — R2689 Other abnormalities of gait and mobility: Secondary | ICD-10-CM | POA: Insufficient documentation

## 2021-01-04 DIAGNOSIS — R262 Difficulty in walking, not elsewhere classified: Secondary | ICD-10-CM | POA: Insufficient documentation

## 2021-01-04 DIAGNOSIS — M25551 Pain in right hip: Secondary | ICD-10-CM | POA: Insufficient documentation

## 2021-01-04 NOTE — Patient Instructions (Signed)
Access Code: [email or web address] URL: [email or web address] Date: 01/04/2021 Prepared by: Josue Hector  Exercises Supine Heel Slide - 3 x daily - 7 x weekly - 2 sets - 10 reps Supine Quad Set - 3 x daily - 7 x weekly - 2 sets - 10 reps - 5 second hold Supine Gluteal Sets - 3 x daily - 7 x weekly - 2 sets - 10 reps - 5 second hold Supine Hip Abduction - 3 x daily - 7 x weekly - 2 sets - 10 reps
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
## 2021-01-10 ENCOUNTER — Other Ambulatory Visit: Payer: Self-pay

## 2021-01-10 ENCOUNTER — Ambulatory Visit (HOSPITAL_COMMUNITY): Payer: Self-pay | Admitting: Physical Therapy

## 2021-01-10 DIAGNOSIS — R2689 Other abnormalities of gait and mobility: Secondary | ICD-10-CM

## 2021-01-10 DIAGNOSIS — M25551 Pain in right hip: Secondary | ICD-10-CM

## 2021-01-10 DIAGNOSIS — R262 Difficulty in walking, not elsewhere classified: Secondary | ICD-10-CM

## 2021-01-10 NOTE — Therapy (Signed)
[facility] [address] [place], [place], [address] Phone: [phone]   Fax:  [phone]  Physical Therapy Treatment  Patient Details  Name: Benjamin Robinson MRN: [id number] Date of Birth: Jun 10, 1970 Referring Provider (PT): Katha Hamming MD   Encounter Date: 01/10/2021   PT End of Session - 01/10/21 1308     Visit Number 2    Number of Visits 10    Date for PT Re-Evaluation 02/15/21    Authorization Type Self Pay    PT Start Time 1134    PT Stop Time 1202    PT Time Calculation (min) 28 min    Activity Tolerance Patient tolerated treatment well    Behavior During Therapy [facility] for tasks assessed/performed             Past Medical History:  Diagnosis Date   Acute encephalopathy 05/02/2019   COVID-19    Dehydration 05/02/2019   Pneumonia due to COVID-19 virus 05/01/2019    Past Surgical History:  Procedure Laterality Date   ORIF ACETABULAR FRACTURE Right 12/12/2020   Procedure: OPEN REDUCTION INTERNAL FIXATION (ORIF) ACETABULAR FRACTURE;  Surgeon: Shona Needles, MD;  Location: [facility];  Service: Orthopedics;  Laterality: Right;    There were no vitals filed for this visit.   Subjective Assessment - 01/10/21 1150     Subjective Pt reports some soreness in Rt hip .  States he's been doing his HEP and maintaining his NWB status.    Currently in Pain? No/denies                               [facility] Adult PT Treatment/Exercise - 01/10/21 0001       Ambulation/Gait   Gait Comments Currently non ambulatory, NWB on RLE, stand pivot for transfers      Knee/Hip Exercises: Seated   Long Arc Quad Right;10 reps    Long [employer] Limitations 5" holds      Knee/Hip Exercises: Supine   Quad Sets Right;10 reps    Short Arc Quad Sets Right;10 reps;Limitations    Short Arc Quad Sets Limitations 5" holds    Heel Slides 10 reps;Right    Other Supine Knee/Hip Exercises glute set 10 x 5"    Other Supine Knee/Hip Exercises  hip abduction x 10      Knee/Hip Exercises: Sidelying   Hip ABduction Limitations attempted too painful    Clams 10X5" holds Rt LE                       PT Short Term Goals - 01/10/21 1158       PT SHORT TERM GOAL #1   Title Patient will be independent with initial HEP and self-management strategies to improve functional outcomes    Time 2    Period Weeks    Status On-going    Target Date 01/18/21               PT Long Term Goals - 01/10/21 1158       PT LONG TERM GOAL #1   Title Patient will be independent with advanced HEP and self-management strategies to improve functional outcomes    Time 6    Period Weeks    Status On-going      PT LONG TERM GOAL #2   Title Patient will have equal to or > 4+/5 MMT throughout RLE to improve ability  to perform functional mobility, stair ambulation and ADLs.    Time 6    Period Weeks    Status On-going      PT LONG TERM GOAL #3   Title Patient will improve FOTO score to predicted value to indicate improvement in functional outcomes    Time 6    Period Weeks    Status On-going      PT LONG TERM GOAL #4   Title Patient will be ambulatory in community with no AD, good gait mechanics and no increased RT hip pain (from baseline) to show improved functional mobility and ability to perform ADLs.    Time 6    Period Weeks    Status On-going                   Plan - 01/10/21 1312     Clinical Impression Statement Reviewed goals and POC moving forward.  Continued with NWB exercises.  Attempted  sidelying hip abduction but unable to complete due to pain.  Pt was able to complete sidelying clams but in limited ROM.  Encouraged to continue these exercises at part of HEP.  Discussed continuation of 1X week versus hold until able to be fully FW without opinion either way.  Possibly to discuss with therapist next session    Examination-Activity Limitations Bed Mobility;Locomotion  Level;Lift;Stand;Squat;Stairs;Sleep;Transfers    Examination-Participation Restrictions Community Activity;Occupation;Driving;Shop;Cleaning;Yard Work    Stability/Clinical Decision Making Stable/Uncomplicated    Rehab Potential Good    PT Frequency 2x / week   1 x week for 2 weeks, then 2 x week for 4 weeks   PT Duration 6 weeks    PT Treatment/Interventions ADLs/Self Care Home Management;Moist Heat;Iontophoresis 73m/ml Dexamethasone;Gait training;DME Instruction;Balance training;Neuromuscular re-education;Wheelchair mobility training;Scar mobilization;Passive range of motion;Visual/perceptual remediation/compensation;Vestibular;Spinal Manipulations;Dry needling;Manual techniques;Manual lymph drainage;Patient/family education;Energy conservation;Functional mobility training;Traction;Stair training;Biofeedback;Canalith Repostioning;Ultrasound;Fluidtherapy;Therapeutic activities;Parrafin;Cryotherapy;Electrical Stimulation;Contrast Bath;Therapeutic exercise;Orthotic Fit/Training;Compression bandaging;Splinting;Joint Manipulations;Taping;Vasopneumatic Device    PT Next Visit Plan Review goals and HEP. Progress RLE strength and functional transfer ability. Work on strength and mobility within confines of NWB status (to be updated at next MD appointment. Add supine hip abduction/ addiction isometric, bent knee raise, SAQ, LAQ. Manual mobilization/ PROM as needed    PT Home Exercise Plan Eval: quad set, glute set, heel slide, supine hip abduction    Consulted and Agree with Plan of Care Patient             Patient will benefit from skilled therapeutic intervention in order to improve the following deficits and impairments:  Pain, Improper body mechanics, Increased fascial restricitons, Abnormal gait, Decreased activity tolerance, Decreased range of motion, Decreased strength, Hypomobility, Decreased mobility, Decreased balance, Difficulty walking, Impaired flexibility  Visit Diagnosis: Pain in right  hip  Other abnormalities of gait and mobility  Difficulty in walking, not elsewhere classified     Problem List Patient Active Problem List   Diagnosis Date Noted   [place]abular [place] ([place] 12/12/2020   Seasonal allergies 07/04/2020   Atopic dermatitis 07/04/2020   ATeena Irani PTA/CLT [phone] FTeena Irani PTA 01/10/2021, 1:17 PM  [place] [address]Phone: [phone]  Fax:  [phone] Name: Benjamin HottelMRN: [id number] of Birth: [birth year]

## 2021-01-15 ENCOUNTER — Encounter (HOSPITAL_COMMUNITY): Payer: Self-pay | Admitting: *Deleted

## 2021-01-15 ENCOUNTER — Emergency Department (HOSPITAL_COMMUNITY)
Admission: EM | Admit: 2021-01-15 | Discharge: 2021-01-15 | Disposition: A | Discharge status: Home / Self Care | Payer: Self-pay | Attending: Emergency Medicine | Admitting: Emergency Medicine

## 2021-01-15 DIAGNOSIS — Z7901 Long term (current) use of anticoagulants: Secondary | ICD-10-CM | POA: Insufficient documentation

## 2021-01-15 DIAGNOSIS — R531 Weakness: Secondary | ICD-10-CM | POA: Insufficient documentation

## 2021-01-15 DIAGNOSIS — R5383 Other fatigue: Secondary | ICD-10-CM | POA: Insufficient documentation

## 2021-01-15 DIAGNOSIS — Z8616 Personal history of COVID-19: Secondary | ICD-10-CM | POA: Insufficient documentation

## 2021-01-15 DIAGNOSIS — F419 Anxiety disorder, unspecified: Secondary | ICD-10-CM | POA: Insufficient documentation

## 2021-01-15 LAB — CBC WITH DIFFERENTIAL/PLATELET
Abs Immature Granulocytes: 0.01 10*3/uL (ref 0.00–0.07)
Basophils Absolute: 0 10*3/uL (ref 0.0–0.1)
Basophils Relative: 1 %
Eosinophils Absolute: 0 10*3/uL (ref 0.0–0.5)
Eosinophils Relative: 0 %
HCT: 41.7 % (ref 39.0–52.0)
Hemoglobin: 13.6 g/dL (ref 13.0–17.0)
Immature Granulocytes: 0 %
Lymphocytes Relative: 19 %
Lymphs Abs: 1 10*3/uL (ref 0.7–4.0)
MCH: 27.4 pg (ref 26.0–34.0)
MCHC: 32.6 g/dL (ref 30.0–36.0)
MCV: 83.9 fL (ref 80.0–100.0)
Monocytes Absolute: 0.5 10*3/uL (ref 0.1–1.0)
Monocytes Relative: 9 %
Neutro Abs: 3.8 10*3/uL (ref 1.7–7.7)
Neutrophils Relative %: 71 %
Platelets: 188 10*3/uL (ref 150–400)
RBC: 4.97 MIL/uL (ref 4.22–5.81)
RDW: 13.2 % (ref 11.5–15.5)
WBC: 5.4 10*3/uL (ref 4.0–10.5)
nRBC: 0 % (ref 0.0–0.2)

## 2021-01-15 LAB — BASIC METABOLIC PANEL
Anion gap: 8 (ref 5–15)
BUN: 10 mg/dL (ref 6–20)
CO2: 27 mmol/L (ref 22–32)
Calcium: 9.7 mg/dL (ref 8.9–10.3)
Chloride: 101 mmol/L (ref 98–111)
Creatinine, Ser: 1.04 mg/dL (ref 0.61–1.24)
GFR, Estimated: >60 mL/min (ref >60)
Glucose, Bld: 103 mg/dL — ABNORMAL HIGH (ref 70–99)
Potassium: 4 mmol/L (ref 3.5–5.1)
Sodium: 136 mmol/L (ref 135–145)

## 2021-01-15 NOTE — ED Triage Notes (Signed)
FATIGUE, states he became weak after being outside and becoming overheated, questions if it related to the eliquis he takes

## 2021-01-15 NOTE — Discharge Instructions (Addendum)
Follow up with your Physician for recheck as scheduled

## 2021-01-15 NOTE — ED Notes (Signed)
Pt expressed increased anxiety and worry from being out of work, family issues and raising a teenager. Nurse listened and comforted pt.
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
## 2021-01-18 ENCOUNTER — Ambulatory Visit (HOSPITAL_COMMUNITY): Payer: Self-pay | Admitting: Physical Therapy

## 2021-01-18 ENCOUNTER — Other Ambulatory Visit: Payer: Self-pay

## 2021-01-18 ENCOUNTER — Encounter (HOSPITAL_COMMUNITY): Payer: Self-pay | Admitting: Physical Therapy

## 2021-01-18 DIAGNOSIS — R2689 Other abnormalities of gait and mobility: Secondary | ICD-10-CM

## 2021-01-18 DIAGNOSIS — M25551 Pain in right hip: Secondary | ICD-10-CM

## 2021-01-18 DIAGNOSIS — R262 Difficulty in walking, not elsewhere classified: Secondary | ICD-10-CM

## 2021-01-18 NOTE — Therapy (Signed)
[facility] [address] [place], [place], [address] Phone: [phone]   Fax:  [phone]  Physical Therapy Treatment  Patient Details  Name: Benjamin Robinson MRN: [id number] Date of Birth: 1971-02-17 Referring Provider (PT): Katha Hamming MD   Encounter Date: 01/18/2021   PT End of Session - 01/18/21 1531     Visit Number 3    Number of Visits 10    Date for PT Re-Evaluation 02/15/21    Authorization Type Self Pay    PT Start Time 1520    PT Stop Time 1610    PT Time Calculation (min) 50 min    Activity Tolerance Patient tolerated treatment well    Behavior During Therapy [facility] for tasks assessed/performed             Past Medical History:  Diagnosis Date   Acute encephalopathy 05/02/2019   COVID-19    Dehydration 05/02/2019   Pneumonia due to COVID-19 virus 05/01/2019    Past Surgical History:  Procedure Laterality Date   ORIF ACETABULAR FRACTURE Right 12/12/2020   Procedure: OPEN REDUCTION INTERNAL FIXATION (ORIF) ACETABULAR FRACTURE;  Surgeon: Shona Needles, MD;  Location: [facility];  Service: Orthopedics;  Laterality: Right;    There were no vitals filed for this visit.   Subjective Assessment - 01/18/21 1530     Subjective Patient says he is doing better. Walking with RW but has not been bearing weight through his RT leg.    Pertinent History RT acetabulum ORIF 12/12/20    Limitations Standing;Walking;House hold activities    Currently in Pain? Yes    Pain Score 6     Pain Location Hip    Pain Orientation Right    Pain Descriptors / Indicators Sore    Pain Type Surgical pain                               [facility] Adult PT Treatment/Exercise - 01/18/21 0001       Knee/Hip Exercises: Supine   Quad Sets Right;20 reps    Quad Sets Limitations 5"    Short Arc Quad Sets Right;20 reps    Short Arc Quad Sets Limitations 5"    Heel Slides Right;15 reps    Straight Leg Raises 2 sets;10 reps;Right    Other  Supine Knee/Hip Exercises supine clam GTB 15 x 5"                       PT Short Term Goals - 01/10/21 1158       PT SHORT TERM GOAL #1   Title Patient will be independent with initial HEP and self-management strategies to improve functional outcomes    Time 2    Period Weeks    Status On-going    Target Date 01/18/21               PT Long Term Goals - 01/10/21 1158       PT LONG TERM GOAL #1   Title Patient will be independent with advanced HEP and self-management strategies to improve functional outcomes    Time 6    Period Weeks    Status On-going      PT LONG TERM GOAL #2   Title Patient will have equal to or > 4+/5 MMT throughout RLE to improve ability to perform functional mobility, stair ambulation and ADLs.    Time 6  Period Weeks    Status On-going      PT LONG TERM GOAL #3   Title Patient will improve FOTO score to predicted value to indicate improvement in functional outcomes    Time 6    Period Weeks    Status On-going      PT LONG TERM GOAL #4   Title Patient will be ambulatory in community with no AD, good gait mechanics and no increased RT hip pain (from baseline) to show improved functional mobility and ability to perform ADLs.    Time 6    Period Weeks    Status On-going                   Plan - 01/18/21 1624     Clinical Impression Statement Patient tolerated session well today. Shows good return with prior HEP exercise. Patient cued on improved quad activation with quad set and SAQs, and hold times with isometrics. Patient still with hip pain/ discomfort during attempted side lying leg raise. Modified to supine band resisted clamshell for glute Medius strengthening. Patient tolerated this much better. Patient noted walking with TTWB, educated patient on current NWB status and to elevate RLE during swing to avoid WB through RT side. Also adjusted height of RW for improved ergonomics during gait. Will continue to benefit  from skilled therapy services to progress hip strength and mobility for reduced pain and improved functional ability.    Examination-Activity Limitations Bed Mobility;Locomotion Level;Lift;Stand;Squat;Stairs;Sleep;Transfers    Examination-Participation Restrictions Community Activity;Occupation;Driving;Shop;Cleaning;Yard Work    Stability/Clinical Decision Making Stable/Uncomplicated    Rehab Potential Good    PT Frequency 2x / week   1 x week for 2 weeks, then 2 x week for 4 weeks   PT Duration 6 weeks    PT Treatment/Interventions ADLs/Self Care Home Management;Moist Heat;Iontophoresis 69m/ml Dexamethasone;Gait training;DME Instruction;Balance training;Neuromuscular re-education;Wheelchair mobility training;Scar mobilization;Passive range of motion;Visual/perceptual remediation/compensation;Vestibular;Spinal Manipulations;Dry needling;Manual techniques;Manual lymph drainage;Patient/family education;Energy conservation;Functional mobility training;Traction;Stair training;Biofeedback;Canalith Repostioning;Ultrasound;Fluidtherapy;Therapeutic activities;Parrafin;Cryotherapy;Electrical Stimulation;Contrast Bath;Therapeutic exercise;Orthotic Fit/Training;Compression bandaging;Splinting;Joint Manipulations;Taping;Vasopneumatic Device    PT Next Visit Plan Progress RLE strength and functional transfer ability. Work on strength and mobility within confines of NWB status (to be updated at next MD appointment). Add supine hip adduction isometric, bent knee raise, LAQ. Manual mobilization/ PROM as needed    PT Home Exercise Plan Eval: quad set, glute set, heel slide, supine hip abduction 9/28 supine clam    Consulted and Agree with Plan of Care Patient             Patient will benefit from skilled therapeutic intervention in order to improve the following deficits and impairments:  Pain, Improper body mechanics, Increased fascial restricitons, Abnormal gait, Decreased activity tolerance, Decreased range of  motion, Decreased strength, Hypomobility, Decreased mobility, Decreased balance, Difficulty walking, Impaired flexibility  Visit Diagnosis: Pain in right hip  Other abnormalities of gait and mobility  Difficulty in walking, not elsewhere classified     Problem List Patient Active Problem List   Diagnosis Date Noted   Acetabular fracture ([place] 12/12/2020   Seasonal allergies 07/04/2020   Atopic dermatitis 07/04/2020   4:30 PM, 01/18/21 CJosue HectorPT DPT  Physical The[facility] [phone] [address]Phone: [phone]  Fax:  [phone] Name: Benjamin GentileMRN: [id number] of Birth: 727-Jun-1972

## 2021-01-24 ENCOUNTER — Other Ambulatory Visit: Payer: Self-pay

## 2021-01-24 ENCOUNTER — Ambulatory Visit (HOSPITAL_COMMUNITY): Payer: Self-pay | Attending: Student | Admitting: Physical Therapy

## 2021-01-24 DIAGNOSIS — R2689 Other abnormalities of gait and mobility: Secondary | ICD-10-CM | POA: Insufficient documentation

## 2021-01-24 DIAGNOSIS — M25551 Pain in right hip: Secondary | ICD-10-CM | POA: Insufficient documentation

## 2021-01-24 DIAGNOSIS — R262 Difficulty in walking, not elsewhere classified: Secondary | ICD-10-CM | POA: Insufficient documentation

## 2021-01-24 NOTE — Therapy (Signed)
[facility] [address] [place], [place], [address] Phone: [phone]   Fax:  [phone]  Physical Therapy Treatment  Patient Details  Name: Benjamin Robinson MRN: [id number] Date of Birth: Nov 04, 1970 Referring Provider (PT): Benjamin Hamming MD   Encounter Date: 01/24/2021   PT End of Session - 01/24/21 1110     Visit Number 4    Number of Visits 10    Date for PT Re-Evaluation 02/15/21    Authorization Type Self Pay    PT Start Time 1013    PT Stop Time 1045    PT Time Calculation (min) 32 min    Activity Tolerance Patient tolerated treatment well    Behavior During Therapy Benjamin Robinson for tasks assessed/performed             Past Medical History:  Diagnosis Date   Acute encephalopathy 05/02/2019   COVID-19    Dehydration 05/02/2019   Pneumonia due to COVID-19 virus 05/01/2019    Past Surgical History:  Procedure Laterality Date   ORIF ACETABULAR FRACTURE Right 12/12/2020   Procedure: OPEN REDUCTION INTERNAL FIXATION (ORIF) ACETABULAR FRACTURE;  Surgeon: Benjamin Needles, MD;  Location: [facility];  Service: Orthopedics;  Laterality: Right;    There were no vitals filed for this visit.   Subjective Assessment - 01/24/21 1017     Subjective pt states he returns to MD today.  Reports compliance with HEP and pain a little better at 4/10    Currently in Pain? Yes    Pain Score 4     Pain Location Hip    Pain Orientation Right    Pain Descriptors / Indicators Aching;Sore    Pain Type Surgical pain                               [facility] Adult PT Treatment/Exercise - 01/24/21 0001       Knee/Hip Exercises: Supine   Short Arc Quad Sets Right;20 reps    Short Arc Quad Sets Limitations 5"    Heel Slides Right;15 reps    Bridges Both;15 reps    Straight Leg Raises 2 sets;Right;15 reps    Other Supine Knee/Hip Exercises supine clam GTB 15 x 5"                       PT Short Term Goals - 01/10/21 1158        PT SHORT TERM GOAL #1   Title Patient will be independent with initial HEP and self-management strategies to improve functional outcomes    Time 2    Period Weeks    Status On-going    Target Date 01/18/21               PT Long Term Goals - 01/10/21 1158       PT LONG TERM GOAL #1   Title Patient will be independent with advanced HEP and self-management strategies to improve functional outcomes    Time 6    Period Weeks    Status On-going      PT LONG TERM GOAL #2   Title Patient will have equal to or > 4+/5 MMT throughout RLE to improve ability to perform functional mobility, stair ambulation and ADLs.    Time 6    Period Weeks    Status On-going      PT LONG TERM GOAL #3   Title Patient will  improve FOTO score to predicted value to indicate improvement in functional outcomes    Time 6    Period Weeks    Status On-going      PT LONG TERM GOAL #4   Title Patient will be ambulatory in community with no AD, good gait mechanics and no increased RT hip pain (from baseline) to show improved functional mobility and ability to perform ADLs.    Time 6    Period Weeks    Status On-going                   Plan - 01/24/21 1106     Clinical Impression Statement Pt late for appointment. Continued with established NWB therex.  Cues to complete therex slowly and controlled.  No pain or issues noted with exercises this session.  Unable to complete full session due to patient arriving late today.  Completed gait training with encouragement to place Rt LE down rather than hopping with gait.  Pt able to complete and maintain NWB status in more normal gait pattern.    Examination-Activity Limitations Bed Mobility;Locomotion Level;Lift;Stand;Squat;Stairs;Sleep;Transfers    Examination-Participation Restrictions Community Activity;Occupation;Driving;Shop;Cleaning;Yard Work    Stability/Clinical Decision Making Stable/Uncomplicated    Rehab Potential Good    PT Frequency 2x / week    1 x week for 2 weeks, then 2 x week for 4 weeks   PT Duration 6 weeks    PT Treatment/Interventions ADLs/Self Care Home Management;Moist Heat;Iontophoresis 42m/ml Dexamethasone;Gait training;DME Instruction;Balance training;Neuromuscular re-education;Wheelchair mobility training;Scar mobilization;Passive range of motion;Visual/perceptual remediation/compensation;Vestibular;Spinal Manipulations;Dry needling;Manual techniques;Manual lymph drainage;Patient/family education;Energy conservation;Functional mobility training;Traction;Stair training;Biofeedback;Canalith Repostioning;Ultrasound;Fluidtherapy;Therapeutic activities;Parrafin;Cryotherapy;Electrical Stimulation;Contrast Bath;Therapeutic exercise;Orthotic Fit/Training;Compression bandaging;Splinting;Joint Manipulations;Taping;Vasopneumatic Device    PT Next Visit Plan Progress RLE strength and functional transfer ability. Work on strength and mobility within confines of NWB status (to be updated at next MD appointment). Benjamin Robinson with MD appointment and if able to increase WB status next session.    PT Home Exercise Plan Eval: quad set, glute set, heel slide, supine hip abduction 9/28 supine clam    Consulted and Agree with Plan of Care Patient             Patient will benefit from skilled therapeutic intervention in order to improve the following deficits and impairments:  Pain, Improper body mechanics, Increased fascial restricitons, Abnormal gait, Decreased activity tolerance, Decreased range of motion, Decreased strength, Hypomobility, Decreased mobility, Decreased balance, Difficulty walking, Impaired flexibility  Visit Diagnosis: Pain in right hip  Other abnormalities of gait and mobility  Difficulty in walking, not elsewhere classified     Problem List Patient Active Problem List   Diagnosis Date Noted   Acetabular fracture ([place] 12/12/2020   Seasonal allergies 07/04/2020   Atopic dermatitis 07/04/2020   Kyrin Gratz B FMare Ferrari PTA/CLT,  WTA [phone] FTeena Irani PTA 01/24/2021, 11:10 AM  CF[facility] [address]Phone: [phone]  Fax:  [phone] Name: ARashidi LohMRN: [id number] of Birth: [birth year]

## 2021-01-26 ENCOUNTER — Other Ambulatory Visit: Payer: Self-pay

## 2021-01-26 ENCOUNTER — Encounter (HOSPITAL_COMMUNITY): Payer: Self-pay | Admitting: Physical Therapy

## 2021-01-26 ENCOUNTER — Ambulatory Visit (HOSPITAL_COMMUNITY): Payer: Self-pay | Admitting: Physical Therapy

## 2021-01-26 DIAGNOSIS — R2689 Other abnormalities of gait and mobility: Secondary | ICD-10-CM

## 2021-01-26 DIAGNOSIS — R262 Difficulty in walking, not elsewhere classified: Secondary | ICD-10-CM

## 2021-01-26 DIAGNOSIS — M25551 Pain in right hip: Secondary | ICD-10-CM

## 2021-01-26 NOTE — Therapy (Signed)
[facility] [address] [place], [place], [address] Phone: [phone]   Fax:  [phone]  Physical Therapy Treatment  Patient Details  Name: Bryden Darden MRN: [id number] Date of Birth: 03-Feb-1971 Referring Provider (PT): Katha Hamming MD   Encounter Date: 01/26/2021   PT End of Session - 01/26/21 1309     Visit Number 5    Number of Visits 10    Date for PT Re-Evaluation 02/15/21    Authorization Type Self Pay    PT Start Time 1304    PT Stop Time 1345    PT Time Calculation (min) 41 min    Activity Tolerance Patient tolerated treatment well    Behavior During Therapy [facility] for tasks assessed/performed             Past Medical History:  Diagnosis Date   Acute encephalopathy 05/02/2019   COVID-19    Dehydration 05/02/2019   Pneumonia due to COVID-19 virus 05/01/2019    Past Surgical History:  Procedure Laterality Date   ORIF ACETABULAR FRACTURE Right 12/12/2020   Procedure: OPEN REDUCTION INTERNAL FIXATION (ORIF) ACETABULAR FRACTURE;  Surgeon: Shona Needles, MD;  Location: [facility];  Service: Orthopedics;  Laterality: Right;    There were no vitals filed for this visit.   Subjective Assessment - 01/26/21 1307     Subjective Patient doing well. HEP going well. Had MD follow up and has been cleared for WBAT, no ROM restrictions.    Pertinent History RT acetabulum ORIF 12/12/20 *WBAT, no ROM restriction as of 10.6.22    Limitations Standing;Walking;House hold activities    Patient Stated Goals Get back to where I was    Currently in Pain? Yes    Pain Score 1     Pain Location Hip    Pain Orientation Right    Pain Descriptors / Indicators Sore    Pain Type Surgical pain    Pain Onset More than a month ago    Pain Frequency Intermittent                               [facility] Adult PT Treatment/Exercise - 01/26/21 0001       Knee/Hip Exercises: Stretches   Passive Hamstring Stretch Right;3 reps;30 seconds     Passive Hamstring Stretch Limitations seated    Other Knee/Hip Stretches supine thomas stretch with strap 3 x 30"      Knee/Hip Exercises: Standing   Heel Raises Both;10 reps;2 sets    Heel Raises Limitations single leg, toe raise x 15    Hip Abduction 3 sets;10 reps;Knee straight    Hip Extension 3 sets;10 reps    Gait Training 226 feet with SBQC      Knee/Hip Exercises: Seated   Other Seated Knee/Hip Exercises adduction isometric 15x 5"    Sit to Sand 2 sets;10 reps;without UE support                       PT Short Term Goals - 01/10/21 1158       PT SHORT TERM GOAL #1   Title Patient will be independent with initial HEP and self-management strategies to improve functional outcomes    Time 2    Period Weeks    Status On-going    Target Date 01/18/21               PT Long Term Goals -  01/10/21 1158       PT LONG TERM GOAL #1   Title Patient will be independent with advanced HEP and self-management strategies to improve functional outcomes    Time 6    Period Weeks    Status On-going      PT LONG TERM GOAL #2   Title Patient will have equal to or > 4+/5 MMT throughout RLE to improve ability to perform functional mobility, stair ambulation and ADLs.    Time 6    Period Weeks    Status On-going      PT LONG TERM GOAL #3   Title Patient will improve FOTO score to predicted value to indicate improvement in functional outcomes    Time 6    Period Weeks    Status On-going      PT LONG TERM GOAL #4   Title Patient will be ambulatory in community with no AD, good gait mechanics and no increased RT hip pain (from baseline) to show improved functional mobility and ability to perform ADLs.    Time 6    Period Weeks    Status On-going                   Plan - 01/26/21 1350     Clinical Impression Statement Patient tolerated session well today. Due to recent clearance for WBAT, increased standing exercises. Introduced sit to stands and glute  extension for increased LE strength. Patient ambulated with small based quad cane. Educated patient on proper sequencing and mechanics. Patient continues to require skilled therapy to increase LE strength and gait quality to return to PLOF.    Examination-Activity Limitations Bed Mobility;Locomotion Level;Lift;Stand;Squat;Stairs;Sleep;Transfers    Examination-Participation Restrictions Community Activity;Occupation;Driving;Shop;Cleaning;Yard Work    Stability/Clinical Decision Making Stable/Uncomplicated    Rehab Potential Good    PT Frequency 2x / week   1 x week for 2 weeks, then 2 x week for 4 weeks   PT Duration 6 weeks    PT Treatment/Interventions ADLs/Self Care Home Management;Moist Heat;Iontophoresis 35m/ml Dexamethasone;Gait training;DME Instruction;Balance training;Neuromuscular re-education;Wheelchair mobility training;Scar mobilization;Passive range of motion;Visual/perceptual remediation/compensation;Vestibular;Spinal Manipulations;Dry needling;Manual techniques;Manual lymph drainage;Patient/family education;Energy conservation;Functional mobility training;Traction;Stair training;Biofeedback;Canalith Repostioning;Ultrasound;Fluidtherapy;Therapeutic activities;Parrafin;Cryotherapy;Electrical Stimulation;Contrast Bath;Therapeutic exercise;Orthotic Fit/Training;Compression bandaging;Splinting;Joint Manipulations;Taping;Vasopneumatic Device    PT Next Visit Plan Progress RLE strength Progress with WBAT, no ROM restriction. Gait with cane, balance, normalize gait.    PT Home Exercise Plan Eval: quad set, glute set, heel slide, supine hip abduction 9/28 supine clam 10/6 sit tostand, HS stretch, hip extension, heel raise    Consulted and Agree with Plan of Care Patient             Patient will benefit from skilled therapeutic intervention in order to improve the following deficits and impairments:  Pain, Improper body mechanics, Increased fascial restricitons, Abnormal gait, Decreased  activity tolerance, Decreased range of motion, Decreased strength, Hypomobility, Decreased mobility, Decreased balance, Difficulty walking, Impaired flexibility  Visit Diagnosis: Pain in right hip  Other abnormalities of gait and mobility  Difficulty in walking, not elsewhere classified     Problem List Patient Active Problem List   Diagnosis Date Noted   Acetabular fracture ([place] 12/12/2020   Seasonal allergies 07/04/2020   Atopic dermatitis 07/04/2020   1:52 PM, 01/26/21 CJosue HectorPT DPT  [facility] [phone] [address]Phone: [phone]  Fax:  [phone] Name:  Cap Massi MRN: [id number] Date of Birth: 01-16-1971

## 2021-01-26 NOTE — Patient Instructions (Signed)
Access Code: [email or web address] URL: [email or web address] Date: 01/26/2021 Prepared by: Josue Hector  Exercises Sit to Stand Without Arm Support - 2-3 x daily - 7 x weekly - 2 sets - 10 reps Standing Hip Abduction - 2-3 x daily - 7 x weekly - 2 sets - 10 reps Standing Heel Raise with Support - 2-3 x daily - 7 x weekly - 2 sets - 10 reps Seated Hamstring Stretch - 2-3 x daily - 7 x weekly - 1 sets - 3 reps - 30 seconds hold

## 2021-01-31 ENCOUNTER — Ambulatory Visit (HOSPITAL_COMMUNITY): Payer: Self-pay | Admitting: Physical Therapy

## 2021-01-31 ENCOUNTER — Other Ambulatory Visit: Payer: Self-pay

## 2021-01-31 DIAGNOSIS — R2689 Other abnormalities of gait and mobility: Secondary | ICD-10-CM

## 2021-01-31 DIAGNOSIS — R262 Difficulty in walking, not elsewhere classified: Secondary | ICD-10-CM

## 2021-01-31 DIAGNOSIS — M25551 Pain in right hip: Secondary | ICD-10-CM

## 2021-01-31 NOTE — Therapy (Signed)
[facility] [address] [place], [place], [address] Phone: [phone]   Fax:  [phone]  Physical Therapy Treatment  Patient Details  Name: Benjamin Robinson MRN: [id number] Date of Birth: 25-Feb-1971 Referring Provider (PT): Katha Hamming MD   Encounter Date: 01/31/2021   PT End of Session - 01/31/21 1223     Visit Number 6    Number of Visits 10    Date for PT Re-Evaluation 02/15/21    Authorization Type Self Pay    PT Start Time 1004    PT Stop Time 6384    PT Time Calculation (min) 40 min    Activity Tolerance Patient tolerated treatment well    Behavior During Therapy [facility] for tasks assessed/performed             Past Medical History:  Diagnosis Date   Acute encephalopathy 05/02/2019   COVID-19    Dehydration 05/02/2019   Pneumonia due to COVID-19 virus 05/01/2019    Past Surgical History:  Procedure Laterality Date   ORIF ACETABULAR FRACTURE Right 12/12/2020   Procedure: OPEN REDUCTION INTERNAL FIXATION (ORIF) ACETABULAR FRACTURE;  Surgeon: Shona Needles, MD;  Location: [facility];  Service: Orthopedics;  Laterality: Right;    There were no vitals filed for this visit.   Subjective Assessment - 01/31/21 1007     Subjective pt reports no pain level, only some soreness today.    Pertinent History RT acetabulum ORIF 12/12/20 *WBAT, no ROM restriction as of 10.6.22    Currently in Pain? No/denies                               [facility] Adult PT Treatment/Exercise - 01/31/21 0001       Knee/Hip Exercises: Stretches   Other Knee/Hip Stretches standing groin stretch using step 2X20"      Knee/Hip Exercises: Standing   Heel Raises Both;10 reps;2 sets    Heel Raises Limitations single leg, toe raise x 15    Hip Flexion Both;20 reps    Hip Flexion Limitations marching    Forward Lunges Both;15 reps;Limitations    Forward Lunges Limitations onto 4" step no UE    Hip Abduction 3 sets;10 reps;Knee straight     Hip Extension 3 sets;10 reps    Lateral Step Up Both;15 reps;Hand Hold: 1;Step Height: 4"    Forward Step Up Both;15 reps;Step Height: 4";Hand Hold: 1    Stairs 5 RT 7" steps reciprocally with 1 HR    Gait Training 226 feet with SPC                       PT Short Term Goals - 01/10/21 1158       PT SHORT TERM GOAL #1   Title Patient will be independent with initial HEP and self-management strategies to improve functional outcomes    Time 2    Period Weeks    Status On-going    Target Date 01/18/21               PT Long Term Goals - 01/10/21 1158       PT LONG TERM GOAL #1   Title Patient will be independent with advanced HEP and self-management strategies to improve functional outcomes    Time 6    Period Weeks    Status On-going      PT LONG TERM GOAL #2   Title  Patient will have equal to or > 4+/5 MMT throughout RLE to improve ability to perform functional mobility, stair ambulation and ADLs.    Time 6    Period Weeks    Status On-going      PT LONG TERM GOAL #3   Title Patient will improve FOTO score to predicted value to indicate improvement in functional outcomes    Time 6    Period Weeks    Status On-going      PT LONG TERM GOAL #4   Title Patient will be ambulatory in community with no AD, good gait mechanics and no increased RT hip pain (from baseline) to show improved functional mobility and ability to perform ADLs.    Time 6    Period Weeks    Status On-going                   Plan - 01/31/21 1225     Clinical Impression Statement Pt returns to day using SBQC.  Continued with standing, WB activities.  Cues to complete therex more slowly, controlled.  Able to maintain 30" stance time with SLS and tandem activities on solid surface.  Improved gait and cadence using SPC as compared to QC and recommended this moving forward.  PT is also walking short distances without AD. Pt able to negotiate stairs reciprocally with one HR, no  antalgia, weakness or pain noted.  Pt is overall progressing well and had no issues with increased activity and exercises this session.    Examination-Activity Limitations Bed Mobility;Locomotion Level;Lift;Stand;Squat;Stairs;Sleep;Transfers    Examination-Participation Restrictions Community Activity;Occupation;Driving;Shop;Cleaning;Yard Work    Stability/Clinical Decision Making Stable/Uncomplicated    Rehab Potential Good    PT Frequency 2x / week   1 x week for 2 weeks, then 2 x week for 4 weeks   PT Duration 6 weeks    PT Treatment/Interventions ADLs/Self Care Home Management;Moist Heat;Iontophoresis 54m/ml Dexamethasone;Gait training;DME Instruction;Balance training;Neuromuscular re-education;Wheelchair mobility training;Scar mobilization;Passive range of motion;Visual/perceptual remediation/compensation;Vestibular;Spinal Manipulations;Dry needling;Manual techniques;Manual lymph drainage;Patient/family education;Energy conservation;Functional mobility training;Traction;Stair training;Biofeedback;Canalith Repostioning;Ultrasound;Fluidtherapy;Therapeutic activities;Parrafin;Cryotherapy;Electrical Stimulation;Contrast Bath;Therapeutic exercise;Orthotic Fit/Training;Compression bandaging;Splinting;Joint Manipulations;Taping;Vasopneumatic Device    PT Next Visit Plan Progress RLE strength Progress with WBAT, no ROM restriction. Gait with cane, balance, normalize gait.    PT Home Exercise Plan Eval: quad set, glute set, heel slide, supine hip abduction 9/28 supine clam 10/6 sit tostand, HS stretch, hip extension, heel raise    Consulted and Agree with Plan of Care Patient             Patient will benefit from skilled therapeutic intervention in order to improve the following deficits and impairments:  Pain, Improper body mechanics, Increased fascial restricitons, Abnormal gait, Decreased activity tolerance, Decreased range of motion, Decreased strength, Hypomobility, Decreased mobility, Decreased  balance, Difficulty walking, Impaired flexibility  Visit Diagnosis: Pain in right hip  Other abnormalities of gait and mobility  Difficulty in walking, not elsewhere classified     Problem List Patient Active Problem List   Diagnosis Date Noted   Acetabular fracture ([facility] 12/12/2020   Seasonal allergies 07/04/2020   Atopic dermatitis 07/04/2020   Lyndy Russman B FMare Ferrari PTA/CLT, WTA [phone] FTeena Irani PTA 01/31/2021, 12:25 PM  [place] [address]Phone: [phone]  Fax:  [phone] Name: Benjamin RekowskiMRN: [id number] of Birth: [birth year]

## 2021-02-02 ENCOUNTER — Ambulatory Visit (HOSPITAL_COMMUNITY): Payer: Self-pay | Admitting: Physical Therapy

## 2021-02-02 ENCOUNTER — Other Ambulatory Visit: Payer: Self-pay

## 2021-02-02 DIAGNOSIS — R2689 Other abnormalities of gait and mobility: Secondary | ICD-10-CM

## 2021-02-02 DIAGNOSIS — R262 Difficulty in walking, not elsewhere classified: Secondary | ICD-10-CM

## 2021-02-02 DIAGNOSIS — M25551 Pain in right hip: Secondary | ICD-10-CM

## 2021-02-02 NOTE — Therapy (Signed)
[facility] [address] [place], [place], [address] Phone: [phone]   Fax:  [phone]  Physical Therapy Treatment  Patient Details  Name: Benjamin Robinson MRN: [id number] Date of Birth: 1971-01-23 Referring Provider (PT): Katha Hamming MD   Encounter Date: 02/02/2021   PT End of Session - 02/02/21 1446     Visit Number 7    Number of Visits 10    Date for PT Re-Evaluation 02/15/21    Authorization Type Self Pay    PT Start Time 1400    PT Stop Time 1438    PT Time Calculation (min) 38 min    Activity Tolerance Patient tolerated treatment well    Behavior During Therapy [facility] for tasks assessed/performed             Past Medical History:  Diagnosis Date   Acute encephalopathy 05/02/2019   COVID-19    Dehydration 05/02/2019   Pneumonia due to COVID-19 virus 05/01/2019    Past Surgical History:  Procedure Laterality Date   ORIF ACETABULAR FRACTURE Right 12/12/2020   Procedure: OPEN REDUCTION INTERNAL FIXATION (ORIF) ACETABULAR FRACTURE;  Surgeon: Shona Needles, MD;  Location: [facility];  Service: Orthopedics;  Laterality: Right;    There were no vitals filed for this visit.   Subjective Assessment - 02/02/21 1409     Subjective soreness in Rt lateral hip.  Reports compliance with HEP    Pain Score 0-No pain                               [facility] Adult PT Treatment/Exercise - 02/02/21 0001       Ambulation/Gait   Gait Comments community distances; no AD today      Knee/Hip Exercises: Stretches   Other Knee/Hip Stretches supine thomas stretch with strap (quad/hip flexor stretch combo) 3 x 30"    Other Knee/Hip Stretches standing groin stretch using step 2X20"      Knee/Hip Exercises: Standing   Heel Raises Both;2 sets;20 reps    Forward Lunges Both;20 reps    Forward Lunges Limitations onto 4" step no UE    Hip Abduction Knee straight;2 sets;15 reps    Hip Extension 2 sets;15 reps    Lateral Step Up  Both;15 reps;Hand Hold: 1;Step Height: 6"    Forward Step Up Both;15 reps;Hand Hold: 1;Step Height: 6"    SLS 30" bilaterally X2 each no UE    Gait Training 226 feet no AD    Other Standing Knee Exercises tandem stance on foam 30"X2                       PT Short Term Goals - 01/10/21 1158       PT SHORT TERM GOAL #1   Title Patient will be independent with initial HEP and self-management strategies to improve functional outcomes    Time 2    Period Weeks    Status On-going    Target Date 01/18/21               PT Long Term Goals - 01/10/21 1158       PT LONG TERM GOAL #1   Title Patient will be independent with advanced HEP and self-management strategies to improve functional outcomes    Time 6    Period Weeks    Status On-going      PT LONG TERM GOAL #2  Title Patient will have equal to or > 4+/5 MMT throughout RLE to improve ability to perform functional mobility, stair ambulation and ADLs.    Time 6    Period Weeks    Status On-going      PT LONG TERM GOAL #3   Title Patient will improve FOTO score to predicted value to indicate improvement in functional outcomes    Time 6    Period Weeks    Status On-going      PT LONG TERM GOAL #4   Title Patient will be ambulatory in community with no AD, good gait mechanics and no increased RT hip pain (from baseline) to show improved functional mobility and ability to perform ADLs.    Time 6    Period Weeks    Status On-going                   Plan - 02/02/21 1511     Clinical Impression Statement Continued with established therex; able to increase reps/sets this session without difficulty or complaints.  Minimal antalgia and no pain voiced during session.  Continues to use [facility], however is completely capable of ambulation without AD.   Added static balance challenges today with ability to maintain for full 30" without LOB for both tandem and SLS.    Pt also able to ambulate without AD with only  minimal analgia but can correct with cues.  Discussed any current limitations or issues in which he replied "none" and states he is completing all ADL's/activities without difficulty.    Examination-Activity Limitations Bed Mobility;Locomotion Level;Lift;Stand;Squat;Stairs;Sleep;Transfers    Examination-Participation Restrictions Community Activity;Occupation;Driving;Shop;Cleaning;Yard Work    Stability/Clinical Decision Making Stable/Uncomplicated    Rehab Potential Good    PT Frequency 2x / week   1 x week for 2 weeks, then 2 x week for 4 weeks   PT Duration 6 weeks    PT Treatment/Interventions ADLs/Self Care Home Management;Moist Heat;Iontophoresis 50m/ml Dexamethasone;Gait training;DME Instruction;Balance training;Neuromuscular re-education;Wheelchair mobility training;Scar mobilization;Passive range of motion;Visual/perceptual remediation/compensation;Vestibular;Spinal Manipulations;Dry needling;Manual techniques;Manual lymph drainage;Patient/family education;Energy conservation;Functional mobility training;Traction;Stair training;Biofeedback;Canalith Repostioning;Ultrasound;Fluidtherapy;Therapeutic activities;Parrafin;Cryotherapy;Electrical Stimulation;Contrast Bath;Therapeutic exercise;Orthotic Fit/Training;Compression bandaging;Splinting;Joint Manipulations;Taping;Vasopneumatic Device    PT Next Visit Plan Progress RLE strength Progress with WBAT, no ROM restriction. Gait with cane, balance, normalize gait.  Review goals next session.    PT Home Exercise Plan Eval: quad set, glute set, heel slide, supine hip abduction 9/28 supine clam 10/6 sit tostand, HS stretch, hip extension, heel raise    Consulted and Agree with Plan of Care Patient             Patient will benefit from skilled therapeutic intervention in order to improve the following deficits and impairments:  Pain, Improper body mechanics, Increased fascial restricitons, Abnormal gait, Decreased activity tolerance, Decreased range  of motion, Decreased strength, Hypomobility, Decreased mobility, Decreased balance, Difficulty walking, Impaired flexibility  Visit Diagnosis: Difficulty in walking, not elsewhere classified  Other abnormalities of gait and mobility  Pain in right hip     Problem List Patient Active Problem List   Diagnosis Date Noted   Ac[place]abular fracture ([place] 12/12/2020   Seasonal allergies 07/04/2020   Atopic dermatitis 07/04/2020   Karissa Meenan B FMare Ferrari PTA/CLT, WTA [phone] FTeena Irani PTA 02/02/2021, 3:11 PM  [place] [address]Phone: [phone]  Fax:  [phone] Name: AAbdelrahman NairMRN: [id number] of Birth: 7Jun 22, 1972

## 2021-02-07 ENCOUNTER — Ambulatory Visit (HOSPITAL_COMMUNITY): Payer: Self-pay | Admitting: Physical Therapy

## 2021-02-07 ENCOUNTER — Encounter (HOSPITAL_COMMUNITY): Payer: Self-pay | Admitting: Physical Therapy

## 2021-02-07 ENCOUNTER — Other Ambulatory Visit: Payer: Self-pay

## 2021-02-07 DIAGNOSIS — R262 Difficulty in walking, not elsewhere classified: Secondary | ICD-10-CM

## 2021-02-07 DIAGNOSIS — M25551 Pain in right hip: Secondary | ICD-10-CM

## 2021-02-07 DIAGNOSIS — R2689 Other abnormalities of gait and mobility: Secondary | ICD-10-CM

## 2021-02-07 NOTE — Patient Instructions (Signed)
Access Code: [facility] URL: [email or web address] Date: 02/07/2021 Prepared by: Josue Hector  Exercises Squat with Chair Touch - 2 x daily - 7 x weekly - 2 sets - 10 reps Single Leg Stance - 2 x daily - 7 x weekly - 1 sets - 3 reps - 30 second hold Side Stepping with Resistance at Ankles - 2 x daily - 7 x weekly - 2 sets - 10 reps

## 2021-02-07 NOTE — Therapy (Signed)
[facility] [address] [place], [place], [address] Phone: [phone]   Fax:  [phone]  Physical Therapy Treatment  Patient Details  Name: Benjamin Robinson MRN: [id number] Date of Birth: May 14, 1970 Referring Provider (PT): Katha Hamming MD   Encounter Date: 02/07/2021   PT End of Session - 02/07/21 1311     Visit Number 8    Number of Visits 10    Date for PT Re-Evaluation 02/15/21    Authorization Type Self Pay    PT Start Time 7001    PT Stop Time 1343    PT Time Calculation (min) 38 min    Activity Tolerance Patient tolerated treatment well    Behavior During Therapy [facility] for tasks assessed/performed             Past Medical History:  Diagnosis Date   Acute encephalopathy 05/02/2019   COVID-19    Dehydration 05/02/2019   Pneumonia due to COVID-19 virus 05/01/2019    Past Surgical History:  Procedure Laterality Date   ORIF ACETABULAR FRACTURE Right 12/12/2020   Procedure: OPEN REDUCTION INTERNAL FIXATION (ORIF) ACETABULAR FRACTURE;  Surgeon: Shona Needles, MD;  Location: [facility];  Service: Orthopedics;  Laterality: Right;    There were no vitals filed for this visit.   Subjective Assessment - 02/07/21 1309     Subjective Patient says he is doing well. Has been walking without cane. Pain is better. Still has some at night on inner thigh.    Pertinent History RT acetabulum ORIF 12/12/20 *WBAT, no ROM restriction as of 10.6.22    Currently in Pain? No/denies                               [facility] Adult PT Treatment/Exercise - 02/07/21 0001       Knee/Hip Exercises: Aerobic   Recumbent Bike 5 min dynamic warmup Lv 2      Knee/Hip Exercises: Machines for Strengthening   Total Gym Leg Press 3 sets x10 5, 7, 10 plates    Other Machine machine walkouts 5 plates x 10      Knee/Hip Exercises: Standing   Forward Step Up 2 sets;15 reps;Hand Hold: 0;Step Height: 6"    Step Down 2 sets;15 reps;Hand Hold: 1;Step  Height: 6"    Functional Squat 2 sets;10 reps   to chair for depth cue   Stairs 5 RT 7" steps reciprocally with no HHA    SLS 2 x 30" solid ground no hands, 2 x 30" on foam int HHA    Other Standing Knee Exercises band sidestepping GTB x 3RT, monster walk FWD/ Back GTB x 3RT                       PT Short Term Goals - 01/10/21 1158       PT SHORT TERM GOAL #1   Title Patient will be independent with initial HEP and self-management strategies to improve functional outcomes    Time 2    Period Weeks    Status On-going    Target Date 01/18/21               PT Long Term Goals - 01/10/21 1158       PT LONG TERM GOAL #1   Title Patient will be independent with advanced HEP and self-management strategies to improve functional outcomes    Time 6  Period Weeks    Status On-going      PT LONG TERM GOAL #2   Title Patient will have equal to or > 4+/5 MMT throughout RLE to improve ability to perform functional mobility, stair ambulation and ADLs.    Time 6    Period Weeks    Status On-going      PT LONG TERM GOAL #3   Title Patient will improve FOTO score to predicted value to indicate improvement in functional outcomes    Time 6    Period Weeks    Status On-going      PT LONG TERM GOAL #4   Title Patient will be ambulatory in community with no AD, good gait mechanics and no increased RT hip pain (from baseline) to show improved functional mobility and ability to perform ADLs.    Time 6    Period Weeks    Status On-going                   Plan - 02/07/21 1345     Clinical Impression Statement Patient demos good progress toward therapy goals and significant improvement in functional ability. Progressed balance activity and LE strengthening exercises. Patient tolerated this well. Progressed patient HEP. Continue to progress to higher level balance and strengthening, possibly DC to HEP if patient agrees in next visit or 2.    Examination-Activity  Limitations Bed Mobility;Locomotion Level;Lift;Stand;Squat;Stairs;Sleep;Transfers    Examination-Participation Restrictions Community Activity;Occupation;Driving;Shop;Cleaning;Yard Work    Stability/Clinical Decision Making Stable/Uncomplicated    Rehab Potential Good    PT Frequency 2x / week   1 x week for 2 weeks, then 2 x week for 4 weeks   PT Duration 6 weeks    PT Treatment/Interventions ADLs/Self Care Home Management;Moist Heat;Iontophoresis 15m/ml Dexamethasone;Gait training;DME Instruction;Balance training;Neuromuscular re-education;Wheelchair mobility training;Scar mobilization;Passive range of motion;Visual/perceptual remediation/compensation;Vestibular;Spinal Manipulations;Dry needling;Manual techniques;Manual lymph drainage;Patient/family education;Energy conservation;Functional mobility training;Traction;Stair training;Biofeedback;Canalith Repostioning;Ultrasound;Fluidtherapy;Therapeutic activities;Parrafin;Cryotherapy;Electrical Stimulation;Contrast Bath;Therapeutic exercise;Orthotic Fit/Training;Compression bandaging;Splinting;Joint Manipulations;Taping;Vasopneumatic Device    PT Next Visit Plan Continue to progress to higher level balance and strengthening, possibly DC to HEP if patient agrees in next visit or 2.    PT Home Exercise Plan Eval: quad set, glute set, heel slide, supine hip abduction 9/28 supine clam 10/6 sit tostand, HS stretch, hip extension, heel raise    Consulted and Agree with Plan of Care Patient             Patient will benefit from skilled therapeutic intervention in order to improve the following deficits and impairments:  Pain, Improper body mechanics, Increased fascial restricitons, Abnormal gait, Decreased activity tolerance, Decreased range of motion, Decreased strength, Hypomobility, Decreased mobility, Decreased balance, Difficulty walking, Impaired flexibility  Visit Diagnosis: Difficulty in walking, not elsewhere classified  Other abnormalities of  gait and mobility  Pain in right hip     Problem List Patient Active Problem List   Diagnosis Date Noted   Acetabular fracture (HFarm [place] 12/12/2020   Seasonal allergies 07/04/2020   Atopic dermatitis 07/04/2020   1:47 PM, 02/07/21 CJosue HectorPT DPT  Physical [facility] [phone]SS[address] [address]Phone: [phone]  Fax:  [phone] Name: AKastiel SimonianMRN: [id number] of Birth: 71972-02-18

## 2021-02-09 ENCOUNTER — Encounter (HOSPITAL_COMMUNITY): Payer: Self-pay | Admitting: Physical Therapy

## 2021-02-09 ENCOUNTER — Other Ambulatory Visit: Payer: Self-pay

## 2021-02-09 ENCOUNTER — Ambulatory Visit (HOSPITAL_COMMUNITY): Payer: Self-pay | Admitting: Physical Therapy

## 2021-02-09 DIAGNOSIS — M25551 Pain in right hip: Secondary | ICD-10-CM

## 2021-02-09 DIAGNOSIS — R262 Difficulty in walking, not elsewhere classified: Secondary | ICD-10-CM

## 2021-02-09 DIAGNOSIS — R2689 Other abnormalities of gait and mobility: Secondary | ICD-10-CM

## 2021-02-09 NOTE — Therapy (Signed)
[place], [place], [address] Phone: [phone]   Fax:  [phone]  Physical Therapy Treatment/Discharge Summary  Patient Details  Name: Benjamin Robinson MRN: [id number] Date of Birth: 04-Sep-1970 Referring Provider (PT): Katha Hamming MD   Encounter Date: 02/09/2021  PHYSICAL THERAPY DISCHARGE SUMMARY  Visits from Start of Care: 9  Current functional level related to goals / functional outcomes: See below   Remaining deficits: See below   Education / Equipment: See below   Patient agrees to discharge. Patient goals were met. Patient is being discharged due to meeting the stated rehab goals.    PT End of Session - 02/09/21 1313     Visit Number 9    Number of Visits 10    Date for PT Re-Evaluation 02/15/21    Authorization Type Self Pay    PT Start Time 1315    PT Stop Time 1330    PT Time Calculation (min) 15 min    Activity Tolerance Patient tolerated treatment well    Behavior During Therapy [facility] for tasks assessed/performed             Past Medical History:  Diagnosis Date   Acute encephalopathy 05/02/2019   COVID-19    Dehydration 05/02/2019   Pneumonia due to COVID-19 virus 05/01/2019    Past Surgical History:  Procedure Laterality Date   ORIF ACETABULAR FRACTURE Right 12/12/2020   Procedure: OPEN REDUCTION INTERNAL FIXATION (ORIF) ACETABULAR FRACTURE;  Surgeon: Shona Needles, MD;  Location: [facility];  Service: Orthopedics;  Laterality: Right;    There were no vitals filed for this visit.   Subjective Assessment - 02/09/21 1314     Subjective Patient states able to perform HEP. He continues to have soreness. He is able to walk without AD. He can walk as long as he wants.    Pertinent History RT acetabulum ORIF 12/12/20 *WBAT, no ROM restriction as of 10.6.22    Currently in Pain? No/denies                [facility] PT Assessment - 02/09/21 0001       Assessment   Medical Diagnosis  Closed nondisplaced fracture of right acetabulum    Referring Provider (PT) Katha Hamming MD    Onset Date/Surgical Date 12/12/20    Prior Therapy No      Precautions   Precautions None      Restrictions   Weight Bearing Restrictions No      Balance Screen   Has the patient fallen in the past 6 months No    Has the patient had a decrease in activity level because of a fear of falling?  No    Is the patient reluctant to leave their home because of a fear of falling?  No      Prior Function   Level of Independence Independent    Vocation Full time employment    Programme researcher, broadcasting/film/video   Overall Cognitive Status Within Functional Limits for tasks assessed      Observation/Other Assessments   Observations Ambulates without AD    Focus on Therapeutic Outcomes (FOTO)  99% function      Strength   Strength Assessment Site Hip;Knee;Ankle    Right/Left Hip Right;Left    Right Hip Flexion 5/5    Right Hip Extension 5/5    Right Hip ABduction 4/5    Left Hip Flexion 5/5  Left Hip Extension 5/5    Left Hip ABduction 5/5    Right/Left Knee Right;Left    Right Knee Flexion 5/5    Right Knee Extension 5/5    Left Knee Flexion 5/5    Left Knee Extension 5/5    Right/Left Ankle Right;Left    Right Ankle Dorsiflexion 5/5    Left Ankle Dorsiflexion 5/5                                    PT Education - 02/09/21 1313     Education Details HEP, reassessment findings    Person(s) Educated Patient    Methods Explanation    Comprehension Verbalized understanding              PT Short Term Goals - 02/09/21 1323       PT SHORT TERM GOAL #1   Title Patient will be independent with initial HEP and self-management strategies to improve functional outcomes    Time 2    Period Weeks    Status Achieved    Target Date 01/18/21               PT Long Term Goals - 02/09/21 1323       PT LONG TERM GOAL #1   Title Patient  will be independent with advanced HEP and self-management strategies to improve functional outcomes    Time 6    Period Weeks    Status Achieved      PT LONG TERM GOAL #2   Title Patient will have equal to or > 4+/5 MMT throughout RLE to improve ability to perform functional mobility, stair ambulation and ADLs.    Time 6    Period Weeks    Status Partially Met      PT LONG TERM GOAL #3   Title Patient will improve FOTO score to predicted value to indicate improvement in functional outcomes    Time 6    Period Weeks    Status Achieved      PT LONG TERM GOAL #4   Title Patient will be ambulatory in community with no AD, good gait mechanics and no increased RT hip pain (from baseline) to show improved functional mobility and ability to perform ADLs.    Time 6    Period Weeks    Status Achieved                   Plan - 02/09/21 1313     Clinical Impression Statement Patient has met 1/1 short term goal and 3/4 long term goals with remaining goal partially met. Goals met with ability to complete HEP and improvement in symptoms, activity tolerance, strength, gait, and functional mobility. Remaining goal partially met due to continued hip abduction weakness and symptoms with testing. Patient educated on returning to PT if needed. Patient discharged from physical therapy at this time.    Examination-Activity Limitations Bed Mobility;Locomotion Level;Lift;Stand;Squat;Stairs;Sleep;Transfers    Examination-Participation Restrictions Community Activity;Occupation;Driving;Shop;Cleaning;Yard Work    Stability/Clinical Decision Making Stable/Uncomplicated    Rehab Potential Good    PT Frequency --    PT Duration --    PT Treatment/Interventions ADLs/Self Care Home Management;Moist Heat;Iontophoresis 53m/ml Dexamethasone;Gait training;DME Instruction;Balance training;Neuromuscular re-education;Wheelchair mobility training;Scar mobilization;Passive range of motion;Visual/perceptual  remediation/compensation;Vestibular;Spinal Manipulations;Dry needling;Manual techniques;Manual lymph drainage;Patient/family education;Energy conservation;Functional mobility training;Traction;Stair training;Biofeedback;Canalith Repostioning;Ultrasound;Fluidtherapy;Therapeutic activities;Parrafin;Cryotherapy;Electrical Stimulation;Contrast Bath;Therapeutic exercise;Orthotic Fit/Training;Compression bandaging;Splinting;Joint Manipulations;Taping;Vasopneumatic Device    PT Next Visit  Plan n/a    PT Home Exercise Plan Eval: quad set, glute set, heel slide, supine hip abduction 9/28 supine clam 10/6 sit tostand, HS stretch, hip extension, heel raise    Consulted and Agree with Plan of Care Patient             Patient will benefit from skilled therapeutic intervention in order to improve the following deficits and impairments:  Pain, Improper body mechanics, Increased fascial restricitons, Abnormal gait, Decreased activity tolerance, Decreased range of motion, Decreased strength, Hypomobility, Decreased mobility, Decreased balance, Difficulty walking, Impaired flexibility  Visit Diagnosis: Difficulty in walking, not elsewhere classified  Other abnormalities of gait and mobility  Pain in right hip     Problem List Patient Active Problem List   Diagnosis Date Noted   Acetabular fracture ([place]) 12/12/2020   Seasonal allergies 07/04/2020   Atopic dermatitis 07/04/2020    1:35 PM, 02/09/21 Mearl Latin PT, DPT Physical Therapist at [place], [place], [address] Phone: [phone]   Fax:  [phone]  Name: Rayhaan Huster MRN: [id number] Date of Birth: December 20, 1970

## 2021-02-14 ENCOUNTER — Encounter (HOSPITAL_COMMUNITY): Payer: Self-pay | Admitting: Physical Therapy

## 2021-02-16 ENCOUNTER — Encounter (HOSPITAL_COMMUNITY): Payer: Self-pay | Admitting: Physical Therapy

## 2021-10-03 ENCOUNTER — Encounter: Payer: Self-pay | Admitting: Internal Medicine

## 2021-11-06 ENCOUNTER — Ambulatory Visit: Payer: Self-pay | Admitting: Gastroenterology

## 2021-11-07 ENCOUNTER — Encounter: Payer: Self-pay | Admitting: Gastroenterology

## 2021-11-07 ENCOUNTER — Ambulatory Visit (INDEPENDENT_AMBULATORY_CARE_PROVIDER_SITE_OTHER): Payer: Self-pay | Admitting: Gastroenterology

## 2021-11-07 ENCOUNTER — Ambulatory Visit: Payer: Self-pay | Admitting: Gastroenterology

## 2021-11-07 DIAGNOSIS — R195 Other fecal abnormalities: Secondary | ICD-10-CM | POA: Insufficient documentation

## 2021-11-07 NOTE — Patient Instructions (Signed)
Colonoscopy to be scheduled. See separate instructions.  ?

## 2021-11-07 NOTE — Progress Notes (Signed)
GI Office Note    Referring Provider: [place], MD Primary Care Physician:  [facility]  Primary Gastroenterologist: Garfield Cornea, MD   Chief Complaint   Chief Complaint  Patient presents with   Colonoscopy    Positive cologuard     History of Present Illness   Benjamin Robinson is a 51 y.o. male presenting today at the request of Dr. Jimmye Norman for further evaluation of positive Cologuard.  Patient reports bowel movements are sticky and sometimes hard to pass.  He has to strain to complete stool.  He has a bowel movement every day after meals, usually 3 times per day.  He has had blood on the toilet tissue in the past but none recently.  Has some vague abdominal pain, on occasion.  Occasional heartburn, does not take any medication for it.  No weight loss.  No prior colonoscopy.    Medications   Current Outpatient Medications  Medication Sig Dispense Refill   multivitamin (ONE-A-DAY MEN'S) TABS tablet Take 1 tablet by mouth daily.     No current facility-administered medications for this visit.    Allergies   Allergies as of 11/07/2021 - Review Complete 11/07/2021  Allergen Reaction Noted   Benzonatate  05/01/2019   Prednisone Other (See Comments) 05/01/2019    Past Medical History   Past Medical History:  Diagnosis Date   Acute encephalopathy 05/02/2019   COVID-19    Dehydration 05/02/2019   Pneumonia due to COVID-19 virus 05/01/2019    Past Surgical History   Past Surgical History:  Procedure Laterality Date   ORIF ACETABULAR FRACTURE Right 12/12/2020   Procedure: OPEN REDUCTION INTERNAL FIXATION (ORIF) ACETABULAR FRACTURE;  Surgeon: Shona Needles, MD;  Location: [facility];  Service: Orthopedics;  Laterality: Right;    Past Family History   Family History  Problem Relation Age of Onset   Healthy Daughter    Healthy Son    Diabetes Paternal Grandmother    Cancer Paternal Grandfather        prostate    Past Social History    Social History   Socioeconomic History   Marital status: Single    Spouse name: Not on file   Number of children: Not on file   Years of education: Not on file   Highest education level: Not on file  Occupational History   Not on file  Tobacco Use   Smoking status: Never    Passive exposure: Never   Smokeless tobacco: Never  Vaping Use   Vaping Use: Never used  Substance and Sexual Activity   Alcohol use: Never   Drug use: Never   Sexual activity: Yes    Partners: Female  Other Topics Concern   Not on file  Social History Narrative   Not on file   Social Determinants of Health   Financial Resource Strain: Not on file  Food Insecurity: Not on file  Transportation Needs: Not on file  Physical Activity: Not on file  Stress: Not on file  Social Connections: Not on file  Intimate Partner Violence: Not on file    Review of Systems   General: Negative for anorexia, weight loss, fever, chills, fatigue, weakness. Eyes: Negative for vision changes.  ENT: Negative for hoarseness, difficulty swallowing , nasal congestion. CV: Negative for chest pain, angina, palpitations, dyspnea on exertion, peripheral edema.  Respiratory: Negative for dyspnea at rest, dyspnea on exertion, cough, sputum, wheezing.  GI: See history of present illness. GU:  Negative  for dysuria, hematuria, urinary incontinence, urinary frequency, nocturnal urination.  MS: Negative for joint pain, low back pain.  Derm: Negative for rash or itching.  Neuro: Negative for weakness, abnormal sensation, seizure, frequent headaches, memory loss,  confusion.  Psych: Negative for anxiety, depression, suicidal ideation, hallucinations.  Endo: Negative for unusual weight change.  Heme: Negative for bruising or bleeding. Allergy: Negative for rash or hives.  Physical Exam   BP 126/78 (BP Location: Left Arm, Patient Position: Sitting, Cuff Size: Large)   Pulse 63   Temp (!) 97.1 F (36.2 C) (Temporal)   Ht 5'  9" (1.753 m)   Wt 215 lb (97.5 kg)   SpO2 98%   BMI 31.75 kg/m    General: Well-nourished, well-developed in no acute distress.  Head: Normocephalic, atraumatic.   Eyes: Conjunctiva pink, no icterus. Mouth: Oropharyngeal mucosa moist and pink , no lesions erythema or exudate. Neck: Supple without thyromegaly, masses, or lymphadenopathy.  Lungs: Clear to auscultation bilaterally.  Heart: Regular rate and rhythm, no murmurs rubs or gallops.  Abdomen: Bowel sounds are normal, nontender, nondistended, no hepatosplenomegaly or masses,  no abdominal bruits or hernia, no rebound or guarding.   Rectal: Deferred Extremities: No lower extremity edema. No clubbing or deformities.  Neuro: Alert and oriented x 4 , grossly normal neurologically.  Skin: Warm and dry, no rash or jaundice.   Psych: Alert and cooperative, normal mood and affect.  Labs   Lab Results  Component Value Date   CREATININE 1.04 01/15/2021   BUN 10 01/15/2021   NA 136 01/15/2021   K 4.0 01/15/2021   CL 101 01/15/2021   CO2 27 01/15/2021   Lab Results  Component Value Date   WBC 5.4 01/15/2021   HGB 13.6 01/15/2021   HCT 41.7 01/15/2021   MCV 83.9 01/15/2021   PLT 188 01/15/2021      Imaging Studies   No results found.  Assessment   Cologuard positive   PLAN   Colonoscopy with Dr. Gala Romney. ASA 2.  I have discussed the risks, alternatives, benefits with regards to but not limited to the risk of reaction to medication, bleeding, infection, perforation and the patient is agreeable to proceed. Written consent to be obtained.    Laureen Ochs. Bobby Rumpf, [place], [facility]

## 2021-11-08 ENCOUNTER — Telehealth: Payer: Self-pay | Admitting: *Deleted

## 2021-11-08 NOTE — Telephone Encounter (Signed)
Patient does not want Gallon prep. He wants to know if he can do miralax prep instead since he has no insurance. Please advise Dr. Gala Romney if okay? Thanks!

## 2021-11-08 NOTE — Telephone Encounter (Signed)
Noted. Pt aware. Instructions mailed

## 2021-12-12 ENCOUNTER — Other Ambulatory Visit: Payer: Self-pay | Admitting: *Deleted

## 2021-12-12 ENCOUNTER — Telehealth: Payer: Self-pay | Admitting: *Deleted

## 2021-12-12 ENCOUNTER — Encounter: Payer: Self-pay | Admitting: *Deleted

## 2021-12-12 MED ORDER — CLENPIQ 10-3.5-12 MG-GM -GM/175ML PO SOLN: 1.0000 | ORAL | 0 refills | Status: DC

## 2021-12-12 NOTE — Telephone Encounter (Signed)
Pt called in. Reports the miralax instructions is too much. He does not want to do all that prep. He asked for something that requires less prep. Advised we can send in low volume he will have to check with the pharmacy regarding price. New instructions sent to mychart.

## 2021-12-18 ENCOUNTER — Ambulatory Visit (HOSPITAL_COMMUNITY): Payer: Self-pay | Admitting: Anesthesiology

## 2021-12-18 ENCOUNTER — Other Ambulatory Visit: Payer: Self-pay

## 2021-12-18 ENCOUNTER — Ambulatory Visit (HOSPITAL_BASED_OUTPATIENT_CLINIC_OR_DEPARTMENT_OTHER): Payer: Self-pay | Admitting: Anesthesiology

## 2021-12-18 ENCOUNTER — Encounter (HOSPITAL_COMMUNITY): Payer: Self-pay | Admitting: Internal Medicine

## 2021-12-18 ENCOUNTER — Ambulatory Visit (HOSPITAL_COMMUNITY)
Admission: RE | Admit: 2021-12-18 | Discharge: 2021-12-18 | Disposition: A | Discharge status: Home / Self Care | Payer: Self-pay | Source: Ambulatory Visit | Attending: Internal Medicine | Admitting: Internal Medicine

## 2021-12-18 ENCOUNTER — Encounter (HOSPITAL_COMMUNITY)
Admission: RE | Disposition: A | Discharge status: Home / Self Care | Payer: Self-pay | Source: Ambulatory Visit | Attending: Internal Medicine

## 2021-12-18 DIAGNOSIS — Z1211 Encounter for screening for malignant neoplasm of colon: Secondary | ICD-10-CM | POA: Insufficient documentation

## 2021-12-18 DIAGNOSIS — K621 Rectal polyp: Secondary | ICD-10-CM

## 2021-12-18 DIAGNOSIS — R195 Other fecal abnormalities: Secondary | ICD-10-CM

## 2021-12-18 HISTORY — PX: POLYPECTOMY: SHX5525

## 2021-12-18 HISTORY — PX: COLONOSCOPY WITH PROPOFOL: SHX5780

## 2021-12-18 SURGERY — COLONOSCOPY WITH PROPOFOL
Anesthesia: General

## 2021-12-18 MED ORDER — LACTATED RINGERS IV SOLN: INTRAVENOUS | Status: DC | PRN

## 2021-12-18 MED ORDER — LIDOCAINE HCL (CARDIAC) PF 100 MG/5ML IV SOSY
PREFILLED_SYRINGE | INTRAVENOUS | Status: DC | PRN
  Administered 2021-12-18: 60 mg via INTRATRACHEAL

## 2021-12-18 MED ORDER — LACTATED RINGERS IV SOLN: INTRAVENOUS | Status: DC

## 2021-12-18 MED ORDER — PROPOFOL 10 MG/ML IV BOLUS
INTRAVENOUS | Status: DC | PRN
  Administered 2021-12-18: 200 mg via INTRAVENOUS

## 2021-12-18 MED ORDER — PROPOFOL 500 MG/50ML IV EMUL
INTRAVENOUS | Status: DC | PRN
  Administered 2021-12-18: 225 ug/kg/min via INTRAVENOUS

## 2021-12-18 NOTE — Transfer of Care (Signed)
Immediate Anesthesia Transfer of Care Note  Patient: Benjamin Robinson  Procedure(s) Performed: COLONOSCOPY WITH PROPOFOL POLYPECTOMY  Patient Location: Short Stay  Anesthesia Type:General  Level of Consciousness: sedated  Airway & Oxygen Therapy: Patient Spontanous Breathing  Post-op Assessment: Report given to RN and Post -op Vital signs reviewed and stable  Post vital signs: Reviewed and stable  Last Vitals:  Vitals Value Taken Time  BP 98/46   Temp 36   Pulse 76   Resp 16   SpO2 96     Last Pain:  Vitals:   12/18/21 0855  TempSrc:   PainSc: 0-No pain      Patients Stated Pain Goal: 9 (44/03/47 4259)  Complications: No notable events documented.

## 2021-12-18 NOTE — Op Note (Addendum)
[facility] Patient Name: Benjamin Robinson Procedure Date: 12/18/2021 8:44 AM MRN: [id number] Date of Birth: 1971-03-13 Attending MD: Norvel Richards , MD CSN: [id number] Age: 51 Admit Type: Outpatient Procedure:                Colonoscopy Indications:              Positive Cologuard test Providers:                Norvel Richards, MD, Lambert Mody,                            Raphael Gibney, Technician Referring MD:              Medicines:                Propofol per Anesthesia Complications:            No immediate complications. Estimated Blood Loss:     Estimated blood loss: none. Procedure:                Pre-Anesthesia Assessment:                           - Prior to the procedure, a History and Physical                            was performed, and patient medications and                            allergies were reviewed. The patient's tolerance of                            previous anesthesia was also reviewed. The risks                            and benefits of the procedure and the sedation                            options and risks were discussed with the patient.                            All questions were answered, and informed consent                            was obtained. Prior Anticoagulants: The patient has                            taken no previous anticoagulant or antiplatelet                            agents. ASA Grade Assessment: II - A patient with                            mild systemic disease. After reviewing the risks  and benefits, the patient was deemed in                            satisfactory condition to undergo the procedure.                           After obtaining informed consent, the colonoscope                            was passed under direct vision. Throughout the                            procedure, the patient's blood pressure, pulse, and                            oxygen saturations were  monitored continuously. The                            [id number]) scope was introduced through the                            anus and advanced to the the cecum, identified by                            appendiceal orifice and ileocecal valve. The                            colonoscopy was performed without difficulty. The                            patient tolerated the procedure well. The quality                            of the bowel preparation was adequate. Scope In: 9:02:29 AM Scope Out: 9:13:52 AM Scope Withdrawal Time: 0 hours 8 minutes 18 seconds  Total Procedure Duration: 0 hours 11 minutes 23 seconds  Findings:      The perianal and digital rectal examinations were normal.      A 5 mm polyp was found in the distal rectum. The polyp was       semi-pedunculated. The polyp was removed with a hot snare. Resection and       retrieval were complete. Estimated blood loss: none. Rectal vault too       small to retroflex. Rectal mucosa seen well on?"face.      The exam was otherwise without abnormality. Impression:               - One 5 mm polyp in the distal rectum, removed with                            a hot snare. Resected and retrieved.                           - The examination was otherwise normal. Moderate Sedation:      Moderate (conscious) sedation was personally administered by an  anesthesia professional. The following parameters were monitored: oxygen       saturation, heart rate, blood pressure, respiratory rate, EKG, adequacy       of pulmonary ventilation, and response to care. Recommendation:           - Patient has a contact number available for                            emergencies. The signs and symptoms of potential                            delayed complications were discussed with the                            patient. Return to normal activities tomorrow.                            Written discharge instructions were provided to the                             patient.                           - Resume previous diet.                           - Continue present medications.                           - Repeat colonoscopy after studies are complete for                            surveillance.                           - Return to GI office (date not yet determined). Procedure Code(s):        --- Professional ---                           [id number], Colonoscopy, flexible; with removal of                            tumor(s), polyp(s), or other lesion(s) by snare                            technique Diagnosis Code(s):        --- Professional ---                           K62.1, Rectal polyp                           R19.5, Other fecal abnormalities CPT copyright 2019 American Medical Association. All rights reserved. The codes documented in this report are preliminary and upon coder review may  be revised to meet current compliance requirements. Cristopher Estimable. Arno Cullers, MD Norvel Richards, MD 12/18/2021 9:22:38 AM This report has been signed electronically. Number  of Addenda: 0

## 2021-12-18 NOTE — H&P (Signed)
$'@LOGO'D$ @   Primary Care Physician:  [facility] Primary Gastroenterologist:  Dr. Gala Romney  Pre-Procedure History & Physical: HPI:  Benjamin Robinson is a 51 y.o. male here for further evaluation of a positive Cologuard.  He is no constipation.  No bleeding.  Past Medical History:  Diagnosis Date   Acute encephalopathy 05/02/2019   COVID-19    Dehydration 05/02/2019   Pneumonia due to COVID-19 virus 05/01/2019    Past Surgical History:  Procedure Laterality Date   ORIF ACETABULAR FRACTURE Right 12/12/2020   Procedure: OPEN REDUCTION INTERNAL FIXATION (ORIF) ACETABULAR FRACTURE;  Surgeon: Shona Needles, MD;  Location: [facility];  Service: Orthopedics;  Laterality: Right;    Prior to Admission medications   Medication Sig Start Date End Date Taking? Authorizing Provider  multivitamin (ONE-A-DAY MEN'S) TABS tablet Take 1 tablet by mouth daily.   Yes [provider]  Sod Picosulfate-Mag Ox-Cit Acd (CLENPIQ) 10-3.5-12 MG-GM -GM/175ML SOLN Take 1 kit by mouth as directed. 12/12/21  Yes Berdella Bacot, Cristopher Estimable, MD    Allergies as of 11/08/2021 - Review Complete 11/07/2021  Allergen Reaction Noted   Benzonatate  05/01/2019   Prednisone Other (See Comments) 05/01/2019    Family History  Problem Relation Age of Onset   Stomach cancer Maternal Grandmother    Diabetes Paternal Grandmother    Cancer Paternal Grandfather        prostate   Healthy Daughter    Healthy Son     Social History   Socioeconomic History   Marital status: Single    Spouse name: Not on file   Number of children: Not on file   Years of education: Not on file   Highest education level: Not on file  Occupational History   Not on file  Tobacco Use   Smoking status: Never    Passive exposure: Never   Smokeless tobacco: Never  Vaping Use   Vaping Use: Never used  Substance and Sexual Activity   Alcohol use: Never   Drug use: Never   Sexual activity: Yes    Partners: Female  Other Topics Concern   Not  on file  Social History Narrative   Not on file   Social Determinants of Health   Financial Resource Strain: Not on file  Food Insecurity: Not on file  Transportation Needs: Not on file  Physical Activity: Not on file  Stress: Not on file  Social Connections: Not on file  Intimate Partner Violence: Not on file    Review of Systems: See HPI, otherwise negative ROS  Physical Exam: BP 123/64   Pulse 74   Temp 97.6 F (36.4 C) (Oral)   Resp 18   Ht $R'5\' 9"'nv$  (1.753 m)   Wt 98.9 kg   SpO2 100%   BMI 32.19 kg/m  General:   Alert,  Well-developed, well-nourished, pleasant and cooperative in NAD Neck:  Supple; no masses or thyromegaly. No significant cervical adenopathy. Lungs:  Clear throughout to auscultation.   No wheezes, crackles, or rhonchi. No acute distress. Heart:  Regular rate and rhythm; no murmurs, clicks, rubs,  or gallops. Abdomen: Non-distended, normal bowel sounds.  Soft and nontender without appreciable mass or hepatosplenomegaly.  Pulses:  Normal pulses noted. Extremities:  Without clubbing or edema.  Impression/Plan: 51 year old gentleman here for further evaluation of a positive Cologuard via colonoscopy.  No bowel symptoms. The risks, benefits, limitations, alternatives and imponderables have been reviewed with the patient. Questions have been answered. All parties are agreeable.  Notice: This dictation was prepared with Dragon dictation along with smaller phrase technology. Any transcriptional errors that result from this process are unintentional and may not be corrected upon review.

## 2021-12-18 NOTE — Anesthesia Postprocedure Evaluation (Signed)
Anesthesia Post Note  Patient: Benjamin Robinson  Procedure(s) Performed: COLONOSCOPY WITH PROPOFOL POLYPECTOMY  Patient location during evaluation: Phase II Anesthesia Type: General Level of consciousness: awake Pain management: pain level controlled Vital Signs Assessment: post-procedure vital signs reviewed and stable Respiratory status: spontaneous breathing and respiratory function stable Cardiovascular status: blood pressure returned to baseline and stable Postop Assessment: no headache and no apparent nausea or vomiting Anesthetic complications: no Comments: Late entry   No notable events documented.   Last Vitals:  Vitals:   12/18/21 0916 12/18/21 0918  BP:  (!) 101/51  Pulse: 80   Resp: 16   Temp: 36.5 C   SpO2: 94%     Last Pain:  Vitals:   12/18/21 0916  TempSrc: Oral  PainSc:                  Louann Sjogren

## 2021-12-18 NOTE — Discharge Instructions (Addendum)
  Colonoscopy Discharge Instructions  Read the instructions outlined below and refer to this sheet in the next few weeks. These discharge instructions provide you with general information on caring for yourself after you leave the hospital. Your doctor may also give you specific instructions. While your treatment has been planned according to the most current medical practices available, unavoidable complications occasionally occur. If you have any problems or questions after discharge, call Dr. Gala Romney at [phone]. ACTIVITY You may resume your regular activity, but move at a slower pace for the next 24 hours.  Take frequent rest periods for the next 24 hours.  Walking will help get rid of the air and reduce the bloated feeling in your belly (abdomen).  No driving for 24 hours (because of the medicine (anesthesia) used during the test).   Do not sign any important legal documents or operate any machinery for 24 hours (because of the anesthesia used during the test).  NUTRITION Drink plenty of fluids.  You may resume your normal diet as instructed by your doctor.  Begin with a light meal and progress to your normal diet. Heavy or fried foods are harder to digest and may make you feel sick to your stomach (nauseated).  Avoid alcoholic beverages for 24 hours or as instructed.  MEDICATIONS You may resume your normal medications unless your doctor tells you otherwise.  WHAT YOU CAN EXPECT TODAY Some feelings of bloating in the abdomen.  Passage of more gas than usual.  Spotting of blood in your stool or on the toilet paper.  IF YOU HAD POLYPS REMOVED DURING THE COLONOSCOPY: No aspirin products for 7 days or as instructed.  No alcohol for 7 days or as instructed.  Eat a soft diet for the next 24 hours.  FINDING OUT THE RESULTS OF YOUR TEST Not all test results are available during your visit. If your test results are not back during the visit, make an appointment with your caregiver to find out the  results. Do not assume everything is normal if you have not heard from your caregiver or the medical facility. It is important for you to follow up on all of your test results.  SEEK IMMEDIATE MEDICAL ATTENTION IF: You have more than a spotting of blood in your stool.  Your belly is swollen (abdominal distention).  You are nauseated or vomiting.  You have a temperature over 101.  You have abdominal pain or discomfort that is severe or gets worse throughout the day.    1 polyp removed from your colon today  Further recommendations to follow pending review of pathology report  Patient request, I called Aloha Gell at [phone] -discussed findings and recommendations

## 2021-12-18 NOTE — Anesthesia Preprocedure Evaluation (Signed)
Anesthesia Evaluation  Patient identified by MRN, date of birth, ID band Patient awake    Reviewed: Allergy & Precautions, H&P , NPO status , Patient's Chart, lab work & pertinent test results, reviewed documented beta blocker date and time   Airway Mallampati: II  TM Distance: >3 FB Neck ROM: full    Dental no notable dental hx.    Pulmonary neg pulmonary ROS,    Pulmonary exam normal breath sounds clear to auscultation       Cardiovascular Exercise Tolerance: Good negative cardio ROS   Rhythm:regular Rate:Normal     Neuro/Psych negative neurological ROS  negative psych ROS   GI/Hepatic negative GI ROS, Neg liver ROS,   Endo/Other  negative endocrine ROS  Renal/GU negative Renal ROS  negative genitourinary   Musculoskeletal   Abdominal   Peds  Hematology negative hematology ROS (+)   Anesthesia Other Findings   Reproductive/Obstetrics negative OB ROS                             Anesthesia Physical Anesthesia Plan  ASA: 2  Anesthesia Plan: General   Post-op Pain Management:    Induction:   PONV Risk Score and Plan: Propofol infusion  Airway Management Planned:   Additional Equipment:   Intra-op Plan:   Post-operative Plan:   Informed Consent: I have reviewed the patients History and Physical, chart, labs and discussed the procedure including the risks, benefits and alternatives for the proposed anesthesia with the patient or authorized representative who has indicated his/her understanding and acceptance.     Dental Advisory Given  Plan Discussed with: CRNA  Anesthesia Plan Comments:         Anesthesia Quick Evaluation

## 2021-12-19 LAB — SURGICAL PATHOLOGY

## 2021-12-21 ENCOUNTER — Encounter (HOSPITAL_COMMUNITY): Payer: Self-pay | Admitting: Internal Medicine

## 2021-12-28 ENCOUNTER — Encounter: Payer: Self-pay | Admitting: Internal Medicine

## 2022-01-30 ENCOUNTER — Encounter (HOSPITAL_COMMUNITY): Payer: Self-pay

## 2022-01-30 ENCOUNTER — Emergency Department (HOSPITAL_COMMUNITY): Payer: Self-pay

## 2022-01-30 ENCOUNTER — Other Ambulatory Visit: Payer: Self-pay

## 2022-01-30 ENCOUNTER — Emergency Department (HOSPITAL_COMMUNITY)
Admission: EM | Admit: 2022-01-30 | Discharge: 2022-01-30 | Discharge status: Against Medical Advice | Payer: Self-pay | Attending: Emergency Medicine | Admitting: Emergency Medicine

## 2022-01-30 DIAGNOSIS — M542 Cervicalgia: Secondary | ICD-10-CM | POA: Insufficient documentation

## 2022-01-30 DIAGNOSIS — Z5321 Procedure and treatment not carried out due to patient leaving prior to being seen by health care provider: Secondary | ICD-10-CM | POA: Insufficient documentation

## 2022-01-30 NOTE — ED Provider Triage Note (Signed)
Emergency Medicine Provider Triage Evaluation Note  Benjamin Robinson , a 51 y.o. male  was evaluated in triage.  Pt complains of neck pain.  He states that he has had this for a long time, possible years.  Denies new injuries.  He has not had this checked before.  Request x-ray.  No arm pain, but does not feel like he has the grip strength that he should bilaterally.  Neck pain is worse when he looks up.   Review of Systems  Positive: Neck pain Negative: Arm pain  Physical Exam  BP (!) 147/83 (BP Location: Right Arm)   Pulse 64   Temp 98 F (36.7 C) (Oral)   Resp 15   Ht '5\' 9"'$  (1.753 m)   Wt 98.9 kg   SpO2 100%   BMI 32.19 kg/m  Gen:   Awake, no distress   Resp:  Normal effort  MSK:   Moves extremities without difficulty  Other:  Mid c-spine tenderness to palp, no decreased ROM, grip strength equal bilaterally  Medical Decision Making  Medically screening exam initiated at 6:26 PM.  Appropriate orders placed.  Jahn Franchini was informed that the remainder of the evaluation will be completed by another provider, this initial triage assessment does not replace that evaluation, and the importance of remaining in the ED until their evaluation is complete.     Carlisle Cater, PA-C 01/30/22 1829

## 2022-01-30 NOTE — ED Triage Notes (Signed)
Patient presents to ED with complaints of neck pain x 3 weeks

## 2022-03-17 ENCOUNTER — Emergency Department (HOSPITAL_COMMUNITY)
Admission: EM | Admit: 2022-03-17 | Discharge: 2022-03-18 | Disposition: A | Discharge status: Home / Self Care | Payer: Self-pay | Attending: Emergency Medicine | Admitting: Emergency Medicine

## 2022-03-17 DIAGNOSIS — W11XXXA Fall on and from ladder, initial encounter: Secondary | ICD-10-CM | POA: Insufficient documentation

## 2022-03-17 DIAGNOSIS — S0101XA Laceration without foreign body of scalp, initial encounter: Secondary | ICD-10-CM | POA: Insufficient documentation

## 2022-03-17 DIAGNOSIS — R030 Elevated blood-pressure reading, without diagnosis of hypertension: Secondary | ICD-10-CM | POA: Insufficient documentation

## 2022-03-17 DIAGNOSIS — S0003XA Contusion of scalp, initial encounter: Secondary | ICD-10-CM

## 2022-03-18 ENCOUNTER — Other Ambulatory Visit: Payer: Self-pay

## 2022-03-18 ENCOUNTER — Encounter (HOSPITAL_COMMUNITY): Payer: Self-pay | Admitting: Emergency Medicine

## 2022-03-18 NOTE — Discharge Instructions (Signed)
Apply ice for 30 minutes at a time, 4 times a day.  Ibuprofen and/or acetaminophen as needed for pain.  If you need additional pain relief, take both ibuprofen and acetaminophen together.  When you combine ibuprofen and acetaminophen, you get better pain relief than you get from taking either medication by itself.  If you have any of the signs of a serious head injury, return to the emergency department so we can do a CT scan at that time.

## 2022-03-18 NOTE — ED Triage Notes (Signed)
Pt arrived POV c/o falling off ladder around 12 today hitting his head on the concrete. He reports he was about 3-4 rungs up the ladder before falling.  Denies LOC. Soreness to posterior head, no obvious deformity noted and no lacerations noted. No blood thinner usage. Benjamin Corona Edd Fabian

## 2022-03-18 NOTE — ED Notes (Signed)
Pt alert, oriented, and ambulatory upon DC. Wife with husband. Benjamin Corona Edd Fabian

## 2022-03-18 NOTE — ED Provider Notes (Signed)
[facility] EMERGENCY DEPARTMENT Provider Note   CSN: [id number] Arrival date & time: 03/17/22  2347     History  Chief Complaint  Patient presents with   Fall   Head Injury    Benjamin Robinson is a 51 y.o. male.  The history is provided by the patient.  Fall  Head Injury He fell off of a ladder and hit the back of his head on concrete.  He was on the third or fourth rung of the ladder.  There was no loss of consciousness although he was momentarily stunned.  Since then, there has been no dizziness or incoordination and no visual changes no nausea or vomiting.  He is concerned about the head injury and wants to have a CT scan done.  Of note, he is not on any anticoagulants or antiplatelet agents.   Home Medications Prior to Admission medications   Medication Sig Start Date End Date Taking? Authorizing Provider  multivitamin (ONE-A-DAY MEN'S) TABS tablet Take 1 tablet by mouth daily.    [provider]  Sod Picosulfate-Mag Ox-Cit Acd (CLENPIQ) 10-3.5-12 MG-GM -GM/175ML SOLN Take 1 kit by mouth as directed. 12/12/21   Rourk, Cristopher Estimable, MD      Allergies    Benzonatate and Prednisone    Review of Systems   Review of Systems  All other systems reviewed and are negative.   Physical Exam Updated Vital Signs BP (!) 120/105 (BP Location: Right Arm)   Pulse 63   Temp 98.1 F (36.7 C) (Oral)   Ht 6' (1.829 m)   Wt 99.3 kg   SpO2 98%   BMI 29.70 kg/m  Physical Exam Vitals and nursing note reviewed.   51 year old male, resting comfortably and in no acute distress. Vital signs are significant for elevated diastolic blood pressure. Oxygen saturation is 98%, which is normal. Head is normocephalic.  There is tenderness to palpation over the occipital region, but no hematoma. PERRLA, EOMI. Oropharynx is clear. Neck is nontender and supple without adenopathy or JVD. Back is nontender and there is no CVA tenderness. Lungs are clear without rales, wheezes, or rhonchi. Chest  is nontender. Heart has regular rate and rhythm without murmur. Abdomen is soft, flat, nontender. Extremities have no cyanosis or edema, full range of motion is present. Skin is warm and dry without rash. Neurologic: Mental status is normal, cranial nerves are intact, strength is 5/5 in all 4 extremities.  ED Results / Procedures / Treatments   Labs (all labs ordered are listed, but only abnormal results are displayed) Labs Reviewed - No data to display  EKG None  Radiology No results found.  Procedures Procedures    Medications Ordered in ED Medications - No data to display  ED Course/ Medical Decision Making/ A&P                           Medical Decision Making  Contusion of occiput without evidence of concussion.  At this point, I do not feel he needs a CT scan.  Evaluation by the Nexus head CT instrument puts him at low risk for significant intracranial injury.  I have explained this to the patient.  I am discharging him with instructions to apply ice, use over-the-counter NSAIDs and acetaminophen as needed for pain.  I am giving him head injury instructions and he is to return if any sign of neurologic injury occurs.  Final Clinical Impression(s) / ED Diagnoses Final diagnoses:  Fall from ladder, initial encounter  Contusion of occipital region of scalp, initial encounter    Rx / DC Orders ED Discharge Orders     None         Delora Fuel, MD 00/37/04 [phone]

## 2022-03-19 ENCOUNTER — Emergency Department (HOSPITAL_COMMUNITY): Payer: Self-pay

## 2022-03-19 ENCOUNTER — Other Ambulatory Visit: Payer: Self-pay

## 2022-03-19 ENCOUNTER — Emergency Department (HOSPITAL_COMMUNITY)
Admission: EM | Admit: 2022-03-19 | Discharge: 2022-03-19 | Disposition: A | Discharge status: Home / Self Care | Payer: Self-pay | Attending: Emergency Medicine | Admitting: Emergency Medicine

## 2022-03-19 ENCOUNTER — Encounter (HOSPITAL_COMMUNITY): Payer: Self-pay

## 2022-03-19 DIAGNOSIS — M542 Cervicalgia: Secondary | ICD-10-CM | POA: Insufficient documentation

## 2022-03-19 DIAGNOSIS — W19XXXA Unspecified fall, initial encounter: Secondary | ICD-10-CM

## 2022-03-19 DIAGNOSIS — W11XXXA Fall on and from ladder, initial encounter: Secondary | ICD-10-CM | POA: Insufficient documentation

## 2022-03-19 DIAGNOSIS — R519 Headache, unspecified: Secondary | ICD-10-CM | POA: Insufficient documentation

## 2022-03-19 LAB — COMPREHENSIVE METABOLIC PANEL
ALT: 28 U/L (ref 0–44)
AST: 24 U/L (ref 15–41)
Albumin: 3.7 g/dL (ref 3.5–5.0)
Alkaline Phosphatase: 74 U/L (ref 38–126)
Anion gap: 7 (ref 5–15)
BUN: 17 mg/dL (ref 6–20)
CO2: 26 mmol/L (ref 22–32)
Calcium: 9.2 mg/dL (ref 8.9–10.3)
Chloride: 105 mmol/L (ref 98–111)
Creatinine, Ser: 1.26 mg/dL — ABNORMAL HIGH (ref 0.61–1.24)
GFR, Estimated: >60 mL/min (ref >60)
Glucose, Bld: 122 mg/dL — ABNORMAL HIGH (ref 70–99)
Potassium: 4.5 mmol/L (ref 3.5–5.1)
Sodium: 138 mmol/L (ref 135–145)
Total Bilirubin: 0.9 mg/dL (ref 0.3–1.2)
Total Protein: 6.5 g/dL (ref 6.5–8.1)

## 2022-03-19 LAB — CBC WITH DIFFERENTIAL/PLATELET
Abs Immature Granulocytes: 0.01 10*3/uL (ref 0.00–0.07)
Basophils Absolute: 0.1 10*3/uL (ref 0.0–0.1)
Basophils Relative: 1 %
Eosinophils Absolute: 0.2 10*3/uL (ref 0.0–0.5)
Eosinophils Relative: 4 %
HCT: 43.6 % (ref 39.0–52.0)
Hemoglobin: 13.9 g/dL (ref 13.0–17.0)
Immature Granulocytes: 0 %
Lymphocytes Relative: 36 %
Lymphs Abs: 1.5 10*3/uL (ref 0.7–4.0)
MCH: 26.8 pg (ref 26.0–34.0)
MCHC: 31.9 g/dL (ref 30.0–36.0)
MCV: 84 fL (ref 80.0–100.0)
Monocytes Absolute: 0.5 10*3/uL (ref 0.1–1.0)
Monocytes Relative: 10 %
Neutro Abs: 2.1 10*3/uL (ref 1.7–7.7)
Neutrophils Relative %: 49 %
Platelets: 190 10*3/uL (ref 150–400)
RBC: 5.19 MIL/uL (ref 4.22–5.81)
RDW: 13.1 % (ref 11.5–15.5)
WBC: 4.3 10*3/uL (ref 4.0–10.5)
nRBC: 0 % (ref 0.0–0.2)

## 2022-03-19 NOTE — ED Notes (Signed)
AVS provided to and discussed with patient and family member at bedside. Pt verbalizes understanding of discharge instructions and denies any questions or concerns at this time. Pt has ride home. Pt ambulated out of department independently with steady gait.  

## 2022-03-19 NOTE — ED Provider Notes (Signed)
[facility] EMERGENCY DEPARTMENT Provider Note   CSN: [id number] Arrival date & time: 03/19/22  1013     History  Chief Complaint  Patient presents with   Benjamin Robinson    Olan Kurek is a 51 y.o. male, who presents to the ED secondary to a fall on Saturday night, when he fell off a ladder ladder backwards and hit his head on the ground.  He did not lose consciousness during this event, and sought medical evaluation at [facility], where he did receive a CT scan, and was discharged home with return precautions.  He states this morning he was more sleepy, and had an episode where he had brain fog, and just did not feel right.  Notes that this is since resolved, and he has a little bit of posterior neck pain, and head pain but otherwise unremarkable.  Has not taken anything for the pain.  Denies any weakness on one side of the body, confusion, nausea or vomiting.     Home Medications Prior to Admission medications   Medication Sig Start Date End Date Taking? Authorizing Provider  multivitamin (ONE-A-DAY MEN'S) TABS tablet Take 1 tablet by mouth daily.    [provider]  Sod Picosulfate-Mag Ox-Cit Acd (CLENPIQ) 10-3.5-12 MG-GM -GM/175ML SOLN Take 1 kit by mouth as directed. 12/12/21   Rourk, Cristopher Estimable, MD      Allergies    Benzonatate and Prednisone    Review of Systems   Review of Systems  Musculoskeletal:  Positive for neck pain. Negative for back pain.    Physical Exam Updated Vital Signs BP 129/80   Pulse 62   Temp 98.4 F (36.9 C) (Oral)   Resp 16   Ht _0  (1.854 m)   Wt 99.3 kg   SpO2 100%   BMI 28.89 kg/m  Physical Exam Vitals and nursing note reviewed.  Constitutional:      General: He is not in acute distress.    Appearance: He is well-developed.  HENT:     Head: Normocephalic and atraumatic.  Eyes:     Conjunctiva/sclera: Conjunctivae normal.  Cardiovascular:     Rate and Rhythm: Normal rate and regular rhythm.     Heart sounds: No  murmur heard. Pulmonary:     Effort: Pulmonary effort is normal. No respiratory distress.     Breath sounds: Normal breath sounds.  Abdominal:     Palpations: Abdomen is soft.     Tenderness: There is no abdominal tenderness.  Musculoskeletal:        General: No swelling.     Cervical back: Neck supple.     Comments: +ttp of cervical spine and posterior occipitis, no crepitus or bruising. ROM intact.   Skin:    General: Skin is warm and dry.     Capillary Refill: Capillary refill takes less than 2 seconds.  Neurological:     General: No focal deficit present.     Mental Status: He is alert and oriented to person, place, and time.     Cranial Nerves: No cranial nerve deficit.     Sensory: No sensory deficit.     Motor: No weakness.     Coordination: Coordination normal.     Gait: Gait normal.  Psychiatric:        Mood and Affect: Mood normal.     ED Results / Procedures / Treatments   Labs (all labs ordered are listed, but only abnormal results are displayed) Labs Reviewed  COMPREHENSIVE METABOLIC  PANEL - Abnormal; Notable for the following components:      Result Value   Glucose, Bld 122 (*)    Creatinine, Ser 1.26 (*)    All other components within normal limits  CBC WITH DIFFERENTIAL/PLATELET  RAPID URINE DRUG SCREEN, HOSP PERFORMED    EKG None  Radiology CT Head Wo Contrast  Result Date: 03/19/2022 CLINICAL DATA:  Trauma EXAM: CT HEAD WITHOUT CONTRAST CT CERVICAL SPINE WITHOUT CONTRAST TECHNIQUE: Multidetector CT imaging of the head and cervical spine was performed following the standard protocol without intravenous contrast. Multiplanar CT image reconstructions of the cervical spine were also generated. RADIATION DOSE REDUCTION: This exam was performed according to the departmental dose-optimization program which includes automated exposure control, adjustment of the mA and/or kV according to patient size and/or use of iterative reconstruction technique. COMPARISON:   12/12/20 CT head and cervical spine FINDINGS: CT HEAD FINDINGS Brain: No evidence of acute infarction, hemorrhage, hydrocephalus, extra-axial collection or mass lesion/mass effect. Vascular: No hyperdense vessel or unexpected calcification. Skull: Normal. Negative for fracture or focal lesion. Sinuses/Orbits: No acute finding. Trace bilateral maxillary sinus mucosal thickening. Other: None. CT CERVICAL SPINE FINDINGS Alignment: There is straightening of the normal cervical lordosis. Skull base and vertebrae: No acute fracture. No primary bone lesion or focal pathologic process. There are bridging anterior osteophytes at the C3-C4 and C4-C5 level. There is incomplete osseous fusion at the C4-C5 level (series 13, image 41). The appearance is unchanged compared to 12/12/2020 Soft tissues and spinal canal: No prevertebral fluid or swelling. No visible canal hematoma. Disc levels:  No evidence of high-grade spinal canal stenosis. Upper chest: Negative. Other: none IMPRESSION: 1. No acute intracranial abnormality. 2. No acute cervical spine fracture. Electronically Signed   By: Marin Roberts M.D.   On: 03/19/2022 12:59   CT Cervical Spine Wo Contrast  Result Date: 03/19/2022 CLINICAL DATA:  Trauma EXAM: CT HEAD WITHOUT CONTRAST CT CERVICAL SPINE WITHOUT CONTRAST TECHNIQUE: Multidetector CT imaging of the head and cervical spine was performed following the standard protocol without intravenous contrast. Multiplanar CT image reconstructions of the cervical spine were also generated. RADIATION DOSE REDUCTION: This exam was performed according to the departmental dose-optimization program which includes automated exposure control, adjustment of the mA and/or kV according to patient size and/or use of iterative reconstruction technique. COMPARISON:  12/12/20 CT head and cervical spine FINDINGS: CT HEAD FINDINGS Brain: No evidence of acute infarction, hemorrhage, hydrocephalus, extra-axial collection or mass lesion/mass  effect. Vascular: No hyperdense vessel or unexpected calcification. Skull: Normal. Negative for fracture or focal lesion. Sinuses/Orbits: No acute finding. Trace bilateral maxillary sinus mucosal thickening. Other: None. CT CERVICAL SPINE FINDINGS Alignment: There is straightening of the normal cervical lordosis. Skull base and vertebrae: No acute fracture. No primary bone lesion or focal pathologic process. There are bridging anterior osteophytes at the C3-C4 and C4-C5 level. There is incomplete osseous fusion at the C4-C5 level (series 13, image 41). The appearance is unchanged compared to 12/12/2020 Soft tissues and spinal canal: No prevertebral fluid or swelling. No visible canal hematoma. Disc levels:  No evidence of high-grade spinal canal stenosis. Upper chest: Negative. Other: none IMPRESSION: 1. No acute intracranial abnormality. 2. No acute cervical spine fracture. Electronically Signed   By: Marin Roberts M.D.   On: 03/19/2022 12:59   DG Chest 2 View  Result Date: 03/19/2022 CLINICAL DATA:  Hemoptysis. EXAM: CHEST - 2 VIEW COMPARISON:  December 12, 2020. FINDINGS: The heart size and mediastinal contours are within normal  limits. Both lungs are clear. The visualized skeletal structures are unremarkable. IMPRESSION: No active cardiopulmonary disease. Electronically Signed   By: Marijo Conception M.D.   On: 03/19/2022 12:05    Procedures Procedures   Medications Ordered in ED Medications - No data to display  ED Course/ Medical Decision Making/ A&P                           Medical Decision Making Patient is a 51 year old male, evaluated initially in any pain for head injury after he fell off of a ladder, he did not obtain a CT scan, presents again for reeval as he is now feeling right, and has a headache.  CT cervical spine, CT head unremarkable, ordered by triage.  He is feeling better now, and asking to go home.  He does have tenderness to palpation of the cervical spine but has no fractures  per his cervical spine CT.  He has no neurodeficits on exam.  I discussed caution, protocol, and need for follow-up with PCP.  Return precautions were emphasized.  He is instructed to follow-up with his primary care doctor to avoid further falls.  Offered medications for treatment of pain, he declined.   Final Clinical Impression(s) / ED Diagnoses Final diagnoses:  Neck pain  Fall, initial encounter    Rx / DC Orders ED Discharge Orders     None         Diamantina Monks, Si Gaul, PA 03/19/22 Mervyn Gay, MD 03/19/22 [phone]

## 2022-03-19 NOTE — Discharge Instructions (Signed)
Please follow-up with your primary care doctor if you have severe dizziness, nausea, vomiting please return to the ER.  Make sure you take Tylenol and get lots of rest.  Avoid falling again as this may cause long-term damage.

## 2022-03-19 NOTE — ED Provider Triage Note (Signed)
Emergency Medicine Provider Triage Evaluation Note  Benjamin Robinson, a 51 y.o. male  was evaluated in triage.  Pt complains of headache status post fall.  Stated that he was climbing down a ladder on Saturday and fell backwards at the third step of the ladder hit his head.  He was evaluated at [facility].  And was advised to symptoms persisted to return to the emergency department for further evaluation.  Patient endorses headache and dizziness and neck pain.  Also mention he has had some generalized weakness and coughed up blood x 1 this morning.  Review of Systems  Positive: See above Negative: See above  Physical Exam  BP (!) 145/78 (BP Location: Right Arm)   Pulse 76   Temp 98.1 F (36.7 C) (Oral)   Resp 14   Ht '6\' 1"'$  (1.854 m)   Wt 99.3 kg   SpO2 100%   BMI 28.89 kg/m  Gen:   Awake, no distress,  Resp:  Normal effort  MSK:   Moves extremities without difficulty strength is 3 out of 5 in all extremities, gait is unstable Other:    Medical Decision Making  Medically screening exam initiated at 11:26 AM.  Appropriate orders placed.  Benjamin Robinson was informed that the remainder of the evaluation will be completed by another provider, this initial triage assessment does not replace that evaluation, and the importance of remaining in the ED until their evaluation is complete.  Work up initiated   Benjamin Pho, PA-C 03/19/22 1131

## 2022-03-19 NOTE — ED Triage Notes (Signed)
Pt arrived POV from home c/o a fall off a ladder that happened on Saturday. Pt fell and hit the back of his head and was originally seen at [facility]. Per family member they did not do a scan and now pt is sleeping a lot and has dizziness and a headache. Pt was told to come back in if he started having symptoms.

## 2022-04-04 IMAGING — DX DG HIP (WITH OR WITHOUT PELVIS) 1V PORT*R*
1 series · 1 of 1 positions shown · non-contrast
Comparison: Earlier radiograph dated 12/12/2020.

CLINICAL DATA: Postreduction.

EXAM:
DG HIP (WITH OR WITHOUT PELVIS) 1V PORT RIGHT

[hip ap]
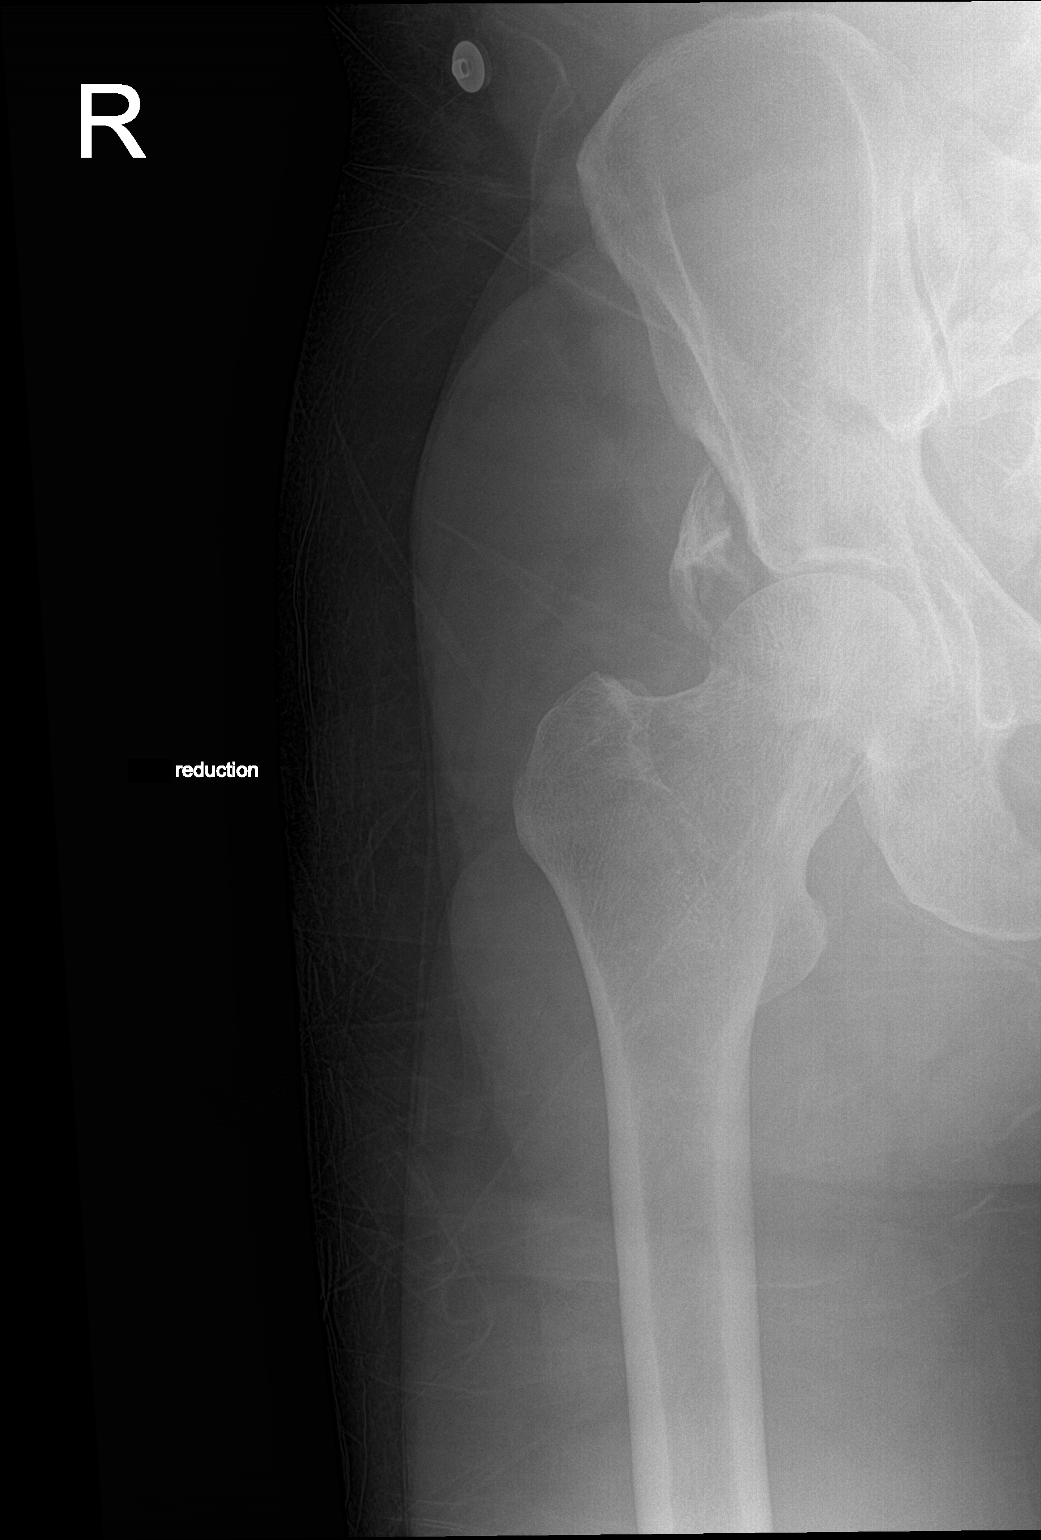

[1 of 1 positions shown; findings below may reference images not displayed]

FINDINGS: Interval reduction of the previously seen dislocated right hip. The
femoral head is now in anatomic alignment with the acetabulum. There
is a displaced fracture of the lateral or posterior acetabular wall.
The soft tissues are grossly unremarkable.
IMPRESSION: 1. Successful reduction of the previously seen dislocated right hip.
2. Displaced fracture of the acetabular wall.

## 2022-12-06 ENCOUNTER — Emergency Department (HOSPITAL_COMMUNITY)
Admission: EM | Admit: 2022-12-06 | Discharge: 2022-12-06 | Disposition: A | Discharge status: Home / Self Care | Payer: Self-pay | Attending: Emergency Medicine | Admitting: Emergency Medicine

## 2022-12-06 ENCOUNTER — Encounter (HOSPITAL_COMMUNITY): Payer: Self-pay | Admitting: Emergency Medicine

## 2022-12-06 ENCOUNTER — Emergency Department (HOSPITAL_COMMUNITY): Payer: Self-pay

## 2022-12-06 ENCOUNTER — Other Ambulatory Visit: Payer: Self-pay

## 2022-12-06 DIAGNOSIS — R1013 Epigastric pain: Secondary | ICD-10-CM | POA: Insufficient documentation

## 2022-12-06 DIAGNOSIS — J168 Pneumonia due to other specified infectious organisms: Secondary | ICD-10-CM | POA: Insufficient documentation

## 2022-12-06 DIAGNOSIS — J189 Pneumonia, unspecified organism: Secondary | ICD-10-CM

## 2022-12-06 DIAGNOSIS — Z20822 Contact with and (suspected) exposure to covid-19: Secondary | ICD-10-CM | POA: Insufficient documentation

## 2022-12-06 LAB — HEPATIC FUNCTION PANEL
ALT: 46 U/L — ABNORMAL HIGH (ref 0–44)
AST: 30 U/L (ref 15–41)
Albumin: 4.2 g/dL (ref 3.5–5.0)
Alkaline Phosphatase: 78 U/L (ref 38–126)
Bilirubin, Direct: 0.1 mg/dL (ref 0.0–0.2)
Indirect Bilirubin: 0.6 mg/dL (ref 0.3–0.9)
Total Bilirubin: 0.7 mg/dL (ref 0.3–1.2)
Total Protein: 7.9 g/dL (ref 6.5–8.1)

## 2022-12-06 LAB — CBC
HCT: 48.3 % (ref 39.0–52.0)
Hemoglobin: 15.5 g/dL (ref 13.0–17.0)
MCH: 26.9 pg (ref 26.0–34.0)
MCHC: 32.1 g/dL (ref 30.0–36.0)
MCV: 83.9 fL (ref 80.0–100.0)
Platelets: 201 10*3/uL (ref 150–400)
RBC: 5.76 MIL/uL (ref 4.22–5.81)
RDW: 13.3 % (ref 11.5–15.5)
WBC: 5.6 10*3/uL (ref 4.0–10.5)
nRBC: 0 % (ref 0.0–0.2)

## 2022-12-06 LAB — BASIC METABOLIC PANEL
Anion gap: 10 (ref 5–15)
BUN: 16 mg/dL (ref 6–20)
CO2: 26 mmol/L (ref 22–32)
Calcium: 9.6 mg/dL (ref 8.9–10.3)
Chloride: 100 mmol/L (ref 98–111)
Creatinine, Ser: 1.24 mg/dL (ref 0.61–1.24)
GFR, Estimated: >60 mL/min (ref >60)
Glucose, Bld: 94 mg/dL (ref 70–99)
Potassium: 3.9 mmol/L (ref 3.5–5.1)
Sodium: 136 mmol/L (ref 135–145)

## 2022-12-06 LAB — LIPASE, BLOOD: Lipase: 27 U/L (ref 11–51)

## 2022-12-06 LAB — BRAIN NATRIURETIC PEPTIDE: B Natriuretic Peptide: 6 pg/mL (ref 0.0–100.0)

## 2022-12-06 LAB — SARS CORONAVIRUS 2 BY RT PCR: SARS Coronavirus 2 by RT PCR: NEGATIVE

## 2022-12-06 LAB — D-DIMER, QUANTITATIVE: D-Dimer, Quant: <0.27 ug{FEU}/mL (ref 0.00–0.50)

## 2022-12-06 LAB — TROPONIN I (HIGH SENSITIVITY)
Troponin I (High Sensitivity): 5 ng/L (ref <18)
Troponin I (High Sensitivity): 5 ng/L (ref <18)

## 2022-12-06 MED ORDER — FAMOTIDINE 20 MG PO TABS
20.0000 mg | ORAL_TABLET | Freq: Once | ORAL | Status: AC
  Administered 2022-12-06: 20 mg via ORAL
  Filled 2022-12-06: qty 1

## 2022-12-06 MED ORDER — ALUM & MAG HYDROXIDE-SIMETH 200-200-20 MG/5ML PO SUSP: 30.0000 mL | Freq: Once | ORAL | Status: DC

## 2022-12-06 MED ORDER — AMOXICILLIN-POT CLAVULANATE 875-125 MG PO TABS: 1.0000 | ORAL_TABLET | Freq: Two times a day (BID) | ORAL | 0 refills | Status: AC

## 2022-12-06 MED ORDER — FAMOTIDINE 20 MG PO TABS: 20.0000 mg | ORAL_TABLET | Freq: Once | ORAL | Status: DC

## 2022-12-06 MED ORDER — LIDOCAINE VISCOUS HCL 2 % MT SOLN
15.0000 mL | Freq: Once | OROMUCOSAL | Status: AC
  Administered 2022-12-06: 15 mL via ORAL
  Filled 2022-12-06: qty 15

## 2022-12-06 MED ORDER — ALBUTEROL SULFATE HFA 108 (90 BASE) MCG/ACT IN AERS: 1.0000 | INHALATION_SPRAY | Freq: Four times a day (QID) | RESPIRATORY_TRACT | 0 refills | Status: AC | PRN

## 2022-12-06 MED ORDER — ALUM & MAG HYDROXIDE-SIMETH 200-200-20 MG/5ML PO SUSP
30.0000 mL | Freq: Once | ORAL | Status: AC
  Administered 2022-12-06: 30 mL via ORAL
  Filled 2022-12-06: qty 30

## 2022-12-06 MED ORDER — IPRATROPIUM-ALBUTEROL 0.5-2.5 (3) MG/3ML IN SOLN
3.0000 mL | Freq: Once | RESPIRATORY_TRACT | Status: AC
  Administered 2022-12-06: 3 mL via RESPIRATORY_TRACT
  Filled 2022-12-06: qty 3

## 2022-12-06 NOTE — Discharge Instructions (Addendum)
You are seen today for continued coughing.  There is no signs of heart attack, blood clot in your lung or heart failure.  We feel that this may represent a pneumonia and or treat you with antibiotics.  Follow-up close with your primary care doctor and come back to the ER for new or worsening symptoms.

## 2022-12-06 NOTE — ED Provider Notes (Signed)
[place] EMERGENCY DEPARTMENT AT [facility] Provider Note   CSN: [id number] Arrival date & time: 12/06/22  1331     History  Chief Complaint  Patient presents with   Chest Pain    Benjamin Robinson is a 52 y.o. male.  Signout taken from [employer] pending BNP for this patient he is 52 year old male who is otherwise healthy family 3 weeks of cough and pleuritic chest pain.  Chest x-ray shows interstitial changes bilaterally, no history of heart failure or other signs of volume overload.  If BMP is normal we will plan on discharge with treatment for community-acquired pneumonia.   Chest Pain      Home Medications Prior to Admission medications   Medication Sig Start Date End Date Taking? Authorizing Provider  multivitamin (ONE-A-DAY MEN'S) TABS tablet Take 1 tablet by mouth daily.   Yes [provider]  triamcinolone cream (KENALOG) 0.1 % Apply 1 Application topically 2 (two) times daily as needed (to affected area). 08/30/22  Yes [provider]      Allergies    Benzonatate and Prednisone    Review of Systems   Review of Systems  Cardiovascular:  Positive for chest pain.    Physical Exam Updated Vital Signs BP (!) 122/108   Pulse (!) 59   Temp 97.7 F (36.5 C) (Oral)   Resp 17   Ht 5\' 9"  (1.753 m)   Wt 97.5 kg   SpO2 97%   BMI 31.75 kg/m  Physical Exam  ED Results / Procedures / Treatments   Labs (all labs ordered are listed, but only abnormal results are displayed) Labs Reviewed  HEPATIC FUNCTION PANEL - Abnormal; Notable for the following components:      Result Value   ALT 46 (*)    All other components within normal limits  SARS CORONAVIRUS 2 BY RT PCR  BASIC METABOLIC PANEL  CBC  LIPASE, BLOOD  D-DIMER, QUANTITATIVE (NOT AT [facility])  BRAIN NATRIURETIC PEPTIDE  TROPONIN I (HIGH SENSITIVITY)  TROPONIN I (HIGH SENSITIVITY)    EKG None  Radiology DG Chest 2 View  Result Date: 12/06/2022 CLINICAL DATA:  Chest pain on  left side and shortness of breath for 3 weeks. EXAM: CHEST - 2 VIEW COMPARISON:  Chest x-ray 03/19/2022 FINDINGS: Normal cardiopericardial silhouette. No consolidation, pneumothorax or effusion. No edema. Mild interstitial changes identified. Increased from previous. Acute process is not excluded. Recommend follow-up. IMPRESSION: Mild interstitial changes noted bilaterally peripherally, new from previous. Acute process is not excluded. Recommend follow up Electronically Signed   By: Karen Kays M.D.   On: 12/06/2022 16:51    Procedures Procedures    Medications Ordered in ED Medications  ipratropium-albuterol (DUONEB) 0.5-2.5 (3) MG/3ML nebulizer solution 3 mL (3 mLs Nebulization Given 12/06/22 1806)  famotidine (PEPCID) tablet 20 mg (20 mg Oral Given 12/06/22 1811)  alum & mag hydroxide-simeth (MAALOX/MYLANTA) 200-200-20 MG/5ML suspension 30 mL (30 mLs Oral Given 12/06/22 1810)    And  lidocaine (XYLOCAINE) 2 % viscous mouth solution 15 mL (15 mLs Oral Given 12/06/22 1810)    ED Course/ Medical Decision Making/ A&P                                 Medical Decision Making Amount and/or Complexity of Data Reviewed Labs: ordered. Radiology: ordered.  Risk OTC drugs. Prescription drug management.   BNP is normal, I reviewed labs again and Theodis Aguas is negative,  dimer is negative, COVID is negative, no anemia, no leukocytosis.  Some interstitial changes on chest x-ray with prolonged cough could be consistent with community-acquired pneumonia will treat with Augmentin he has no other medical conditions to suggest that he needs dual therapy.  Advised on follow-up and return precautions.        Final Clinical Impression(s) / ED Diagnoses Final diagnoses:  None    Rx / DC Orders ED Discharge Orders     None         Josem Kaufmann 12/06/22 Alycia Patten, MD 12/07/22 [phone]

## 2022-12-06 NOTE — ED Provider Notes (Signed)
[place] EMERGENCY DEPARTMENT AT [facility] Provider Note   CSN: [id number] Arrival date & time: 12/06/22  1331     History  Chief Complaint  Patient presents with   Chest Pain    Benjamin Robinson is a 52 y.o. male.   Chest Pain   52 year old male presents emergency department with complaints of left-sided chest pain, shortness of breath, cough.  Patient states that symptoms are present for the past 3 weeks.  Describes left-sided chest pain as sharp in nature and sometimes worsened with taking a deep breath but denies any other known exacerbating or relieving factors.  Denies history of similar symptoms in the past.  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  Denies any exacerbation of symptoms with exertion.  Has tried no medication for his symptoms.  Denies any fever, chills, night sweats, sore throat, nasal congestion, nausea, vomiting, urinary symptoms, change in bowel habits.  Has also reported some epigastric abdominal discomfort without any known exacerbating relieving factors.  Past medical history significant for pneumonia, acute encephalopathy, seasonal allergies  Home Medications Prior to Admission medications   Medication Sig Start Date End Date Taking? Authorizing Provider  multivitamin (ONE-A-DAY MEN'S) TABS tablet Take 1 tablet by mouth daily.   Yes [provider]  triamcinolone cream (KENALOG) 0.1 % Apply 1 Application topically 2 (two) times daily as needed (to affected area). 08/30/22  Yes [provider]      Allergies    Benzonatate and Prednisone    Review of Systems   Review of Systems  Cardiovascular:  Positive for chest pain.  All other systems reviewed and are negative.   Physical Exam Updated Vital Signs BP (!) 122/108   Pulse (!) 59   Temp 97.7 F (36.5 C) (Oral)   Resp 17   Ht 5\' 9"  (1.753 m)   Wt 97.5 kg   SpO2 97%   BMI 31.75 kg/m   Physical Exam Vitals and nursing note reviewed.  Constitutional:      General: He is not in acute distress.    Appearance: He is well-developed.  HENT:     Head: Normocephalic and atraumatic.  Eyes:     Conjunctiva/sclera: Conjunctivae normal.  Cardiovascular:     Rate and Rhythm: Normal rate and regular rhythm.     Heart sounds: No murmur heard. Pulmonary:     Effort: Pulmonary effort is normal. No respiratory distress.     Breath sounds: Normal breath sounds. No wheezing, rhonchi or rales.  Abdominal:     Palpations: Abdomen is soft.     Tenderness: There is no abdominal tenderness. There is no guarding.  Musculoskeletal:        General: No swelling.     Cervical back: Neck supple.     Comments: 0-1+ bilateral lower extremity edema of which patient states is baseline.  Skin:    General: Skin is warm and dry.     Capillary Refill: Capillary refill takes less than 2 seconds.  Neurological:     Mental Status: He is alert.  Psychiatric:        Mood and Affect: Mood normal.     ED Results / Procedures / Treatments   Labs (all labs ordered are listed, but only abnormal results are displayed) Labs Reviewed  HEPATIC FUNCTION PANEL - Abnormal; Notable for the following components:      Result Value   ALT 46 (*)  All other components within normal limits  SARS CORONAVIRUS 2 BY RT PCR  BASIC METABOLIC PANEL  CBC  LIPASE, BLOOD  D-DIMER, QUANTITATIVE (NOT AT [facility])  BRAIN NATRIURETIC PEPTIDE  TROPONIN I (HIGH SENSITIVITY)  TROPONIN I (HIGH SENSITIVITY)    EKG None  Radiology DG Chest 2 View  Result Date: 12/06/2022 CLINICAL DATA:  Chest pain on left side and shortness of breath for 3 weeks. EXAM: CHEST - 2 VIEW COMPARISON:  Chest x-ray 03/19/2022 FINDINGS: Normal cardiopericardial silhouette. No consolidation, pneumothorax or effusion. No edema. Mild interstitial changes identified. Increased from previous. Acute process is not excluded. Recommend follow-up.  IMPRESSION: Mild interstitial changes noted bilaterally peripherally, new from previous. Acute process is not excluded. Recommend follow up Electronically Signed   By: Karen Kays M.D.   On: 12/06/2022 16:51    Procedures Procedures    Medications Ordered in ED Medications  ipratropium-albuterol (DUONEB) 0.5-2.5 (3) MG/3ML nebulizer solution 3 mL (3 mLs Nebulization Given 12/06/22 1806)  famotidine (PEPCID) tablet 20 mg (20 mg Oral Given 12/06/22 1811)  alum & mag hydroxide-simeth (MAALOX/MYLANTA) 200-200-20 MG/5ML suspension 30 mL (30 mLs Oral Given 12/06/22 1810)    And  lidocaine (XYLOCAINE) 2 % viscous mouth solution 15 mL (15 mLs Oral Given 12/06/22 1810)    ED Course/ Medical Decision Making/ A&P                                 Medical Decision Making Amount and/or Complexity of Data Reviewed Labs: ordered. Radiology: ordered.  Risk OTC drugs. Prescription drug management.   This patient presents to the ED for concern of shortness of breath/chest pain, this involves an extensive number of treatment options, and is a complaint that carries with it a high risk of complications and morbidity.  The differential diagnosis includes The causes for shortness of breath include but are not limited to Cardiac (AHF, pericardial effusion and tamponade, arrhythmias, ischemia, etc) Respiratory (COPD, asthma, pneumonia, pneumothorax, primary pulmonary hypertension, PE/VQ mismatch) Hematological (anemia)  Co morbidities that complicate the patient evaluation  See HPI   Additional history obtained:  Additional history obtained from EMR External records from outside source obtained and reviewed including hospital records   Lab Tests:  I Ordered, and personally interpreted labs.  The pertinent results include: No leukocytosis.  No evidence of anemia.  Platelets within range.  No electrolyte abnormalities.  No renal dysfunction.  No transaminitis.  COVID-negative.  Lipase within normal  limits.  Initial troponin of 5 with repeat 5.  D-dimer negative.  BNP pending   Imaging Studies ordered:  I ordered imaging studies including chest x-ray I independently visualized and interpreted imaging which showed mild interstitial changes noted bilaterally. I agree with the radiologist interpretation  Cardiac Monitoring: / EKG:  The patient was maintained on a cardiac monitor.  I personally viewed and interpreted the cardiac monitored which showed an underlying rhythm of: Normal sinus rhythm   Consultations Obtained:  N/a   Problem List / ED Course / Critical interventions / Medication management  Chest pain I ordered medication including DuoNeb, Maalox, lidocaine, Pepcid   Reevaluation of the patient after these medicines showed that the patient improved I have reviewed the patients home medicines and have made adjustments as needed   Social Determinants of Health:  Denies tobacco, illicit drug use   Test / Admission - Considered:  Chest pain Vitals signs significant for hypertension blood pressure 156/94. Otherwise  within normal range and stable throughout visit. Laboratory/imaging studies significant for: See above 52 year old male presents emergency department with complaints of left-sided chest pain, shortness of breath, cough for the past 3 weeks.  Chest pain described as tightness without radiation and slightly pleuritic in nature.  Patient without radiation to back, pulse deficits, neurologic deficits, significant hypertension; low suspicion for aortic dissection.  Patient with negative D-dimer, lack of risk factors for DVT/PE; low suspicion for PE.  Patient with delta negative troponin, lack of acute ischemic changes on EKG.  Patient with heart score of 0-3.  Low suspicion for ACS.  Patient without clinical evidence of volume overload but with some bilateral interstitial changes; awaiting BNP for assessment of possible CHF.  If BNP negative, expect to be treated for  CAP given evidence of cough and interstitial changes on chest x-ray.  At time of shift change, patient care off to [facility].  Patient stable upon shift change.        Final Clinical Impression(s) / ED Diagnoses Final diagnoses:  None    Rx / DC Orders ED Discharge Orders     None         Peter Garter, [place] 12/06/22 1916    Bethann Berkshire, MD 12/07/22 1117

## 2022-12-06 NOTE — ED Triage Notes (Signed)
Pt presents with left-sided CP with SOB x 3 weeks.

## 2023-03-03 ENCOUNTER — Emergency Department (HOSPITAL_COMMUNITY)
Admission: EM | Admit: 2023-03-03 | Discharge: 2023-03-03 | Disposition: A | Discharge status: Home / Self Care | Payer: Self-pay | Attending: Emergency Medicine | Admitting: Emergency Medicine

## 2023-03-03 ENCOUNTER — Other Ambulatory Visit: Payer: Self-pay

## 2023-03-03 ENCOUNTER — Encounter (HOSPITAL_COMMUNITY): Payer: Self-pay

## 2023-03-03 DIAGNOSIS — J029 Acute pharyngitis, unspecified: Secondary | ICD-10-CM | POA: Insufficient documentation

## 2023-03-03 DIAGNOSIS — Z20822 Contact with and (suspected) exposure to covid-19: Secondary | ICD-10-CM | POA: Insufficient documentation

## 2023-03-03 LAB — RESP PANEL BY RT-PCR (RSV, FLU A&B, COVID)  RVPGX2
Influenza A by PCR: NEGATIVE
Influenza B by PCR: NEGATIVE
Resp Syncytial Virus by PCR: NEGATIVE
SARS Coronavirus 2 by RT PCR: NEGATIVE

## 2023-03-03 LAB — GROUP A STREP BY PCR: Group A Strep by PCR: NOT DETECTED

## 2023-03-03 MED ORDER — IBUPROFEN 800 MG PO TABS
800.0000 mg | ORAL_TABLET | Freq: Once | ORAL | Status: AC
  Administered 2023-03-03: 800 mg via ORAL
  Filled 2023-03-03: qty 1

## 2023-03-03 NOTE — ED Notes (Signed)
ED Provider at bedside. 

## 2023-03-03 NOTE — ED Provider Notes (Signed)
EMERGENCY DEPARTMENT AT [facility] Provider Note   CSN: [id number] Arrival date & time: 03/03/23  2038     History  Chief Complaint  Patient presents with   Sore Throat   HPI Benjamin Robinson is a 52 y.o. male presenting for sore throat and productive cough.  Symptoms started about a week ago.  Endorses clear sputum with cough.  Denies fever.  Denies neck pain.  Still able to drink and eat as he normally does.   Sore Throat       Home Medications Prior to Admission medications   Medication Sig Start Date End Date Taking? Authorizing Provider  albuterol (VENTOLIN HFA) 108 (90 Base) MCG/ACT inhaler Inhale 1-2 puffs into the lungs every 6 (six) hours as needed for wheezing or shortness of breath. 12/06/22   Carmel [place] A, PA-C  amoxicillin-clavulanate (AUGMENTIN) 875-125 MG tablet Take 1 tablet by mouth every 12 (twelve) hours. 12/06/22   Carmel [place] A, PA-C  multivitamin (ONE-A-DAY MEN'S) TABS tablet Take 1 tablet by mouth daily.    [provider]  triamcinolone cream (KENALOG) 0.1 % Apply 1 Application topically 2 (two) times daily as needed (to affected area). 08/30/22   [provider]      Allergies    Benzonatate and Prednisone    Review of Systems   See HPI for pertinent positive  Physical Exam Updated Vital Signs BP (!) 162/101   Pulse 98   Temp 98.3 F (36.8 C) (Oral)   Resp 18   Ht 5\' 9"  (1.753 m)   Wt 99.8 kg   SpO2 99%   BMI 32.49 kg/m  Physical Exam Constitutional:      Appearance: Normal appearance.  HENT:     Head: Normocephalic.     Nose: Nose normal.     Mouth/Throat:     Tongue: No lesions. Tongue does not deviate from midline.     Palate: No mass and lesions.     Pharynx: Uvula midline. Pharyngeal swelling, posterior oropharyngeal erythema and uvula swelling present. No oropharyngeal exudate.  Eyes:     Conjunctiva/sclera: Conjunctivae normal.  Cardiovascular:     Rate and Rhythm: Normal rate and  regular rhythm.     Heart sounds: Normal heart sounds.  Pulmonary:     Effort: Pulmonary effort is normal.     Breath sounds: Normal breath sounds. No decreased breath sounds, wheezing, rhonchi or rales.  Musculoskeletal:     Cervical back: Full passive range of motion without pain, normal range of motion and neck supple.  Lymphadenopathy:     Cervical: No cervical adenopathy.  Neurological:     Mental Status: He is alert.  Psychiatric:        Mood and Affect: Mood normal.     ED Results / Procedures / Treatments   Labs (all labs ordered are listed, but only abnormal results are displayed) Labs Reviewed  GROUP A STREP BY PCR  RESP PANEL BY RT-PCR (RSV, FLU A&B, COVID)  [id number]    EKG None  Radiology No results found.  Procedures Procedures    Medications Ordered in ED Medications  ibuprofen (ADVIL) tablet 800 mg (800 mg Oral Given 03/03/23 2119)    ED Course/ Medical Decision Making/ A&P                                 Medical Decision Making Risk Prescription drug management.   52 year old  well-appearing nontoxic male presenting for URI symptoms.  Exam notable for erythema about the posterior oropharynx along with swollen uvula.  DDx includes strep pharyngitis, COVID or other URI, peritonsillar abscess, other.  Strep and respiratory PCR's were negative.  However do suspect this is a viral process.  Advised supportive treatment at home.  No suggestions of abscess on exam.  Advised to follow-up PCP if symptoms persist.  Discussed return precautions.  Vital stable.  Discharged home in good condition.         Final Clinical Impression(s) / ED Diagnoses Final diagnoses:  Sore throat    Rx / DC Orders ED Discharge Orders     None         Gareth Eagle, PA-C 03/03/23 2310    Eber Hong, MD 03/04/23 1016

## 2023-03-03 NOTE — Discharge Instructions (Signed)
Evaluation today was overall reassuring.  I suspect you have a viral process that is causing your symptoms.  Recommend supportive treatment at home which includes rest and hydration.  You can also take Tylenol ibuprofen for fever and symptomatic relief.  Please follow-up with your PCP if her symptoms worsen.

## 2023-03-03 NOTE — ED Triage Notes (Signed)
Sore throat x4-5 days  Per wife, a lot of coughing x1 week Clear septum
